# Patient Record
Sex: Male | Born: 1945
Health system: Southern US, Community
[De-identification: ages and names within clinical notes are randomized; demographics above are authoritative.]

## PROBLEM LIST (undated history)

## (undated) DIAGNOSIS — C8590 Non-Hodgkin lymphoma, unspecified, unspecified site: Secondary | ICD-10-CM

## (undated) DIAGNOSIS — R030 Elevated blood-pressure reading, without diagnosis of hypertension: Secondary | ICD-10-CM

## (undated) DIAGNOSIS — R079 Chest pain, unspecified: Secondary | ICD-10-CM

## (undated) DIAGNOSIS — C801 Malignant (primary) neoplasm, unspecified: Secondary | ICD-10-CM

## (undated) DIAGNOSIS — E785 Hyperlipidemia, unspecified: Secondary | ICD-10-CM

## (undated) DIAGNOSIS — C61 Malignant neoplasm of prostate: Secondary | ICD-10-CM

## (undated) DIAGNOSIS — E1165 Type 2 diabetes mellitus with hyperglycemia: Secondary | ICD-10-CM

## (undated) HISTORY — DX: Elevated blood-pressure reading, without diagnosis of hypertension: R03.0

## (undated) HISTORY — DX: Type 2 diabetes mellitus with hyperglycemia: E11.65

## (undated) HISTORY — DX: Malignant neoplasm of prostate: C61

## (undated) HISTORY — DX: Malignant (primary) neoplasm, unspecified: C80.1

## (undated) HISTORY — PX: BACK SURGERY: SHX140

## (undated) HISTORY — DX: Non-Hodgkin lymphoma, unspecified, unspecified site: C85.90

## (undated) HISTORY — PX: PROSTATECTOMY: SHX69

## (undated) HISTORY — DX: Hyperlipidemia, unspecified: E78.5

---

## 2000-06-29 ENCOUNTER — Ambulatory Visit (HOSPITAL_BASED_OUTPATIENT_CLINIC_OR_DEPARTMENT_OTHER): Admission: RE | Admit: 2000-06-29 | Discharge: 2000-06-29 | Payer: Self-pay | Admitting: Orthopaedic Surgery

## 2008-12-12 ENCOUNTER — Inpatient Hospital Stay (HOSPITAL_COMMUNITY): Admission: RE | Admit: 2008-12-12 | Discharge: 2008-12-14 | Payer: Self-pay | Admitting: Urology

## 2008-12-12 ENCOUNTER — Encounter (INDEPENDENT_AMBULATORY_CARE_PROVIDER_SITE_OTHER): Payer: Self-pay | Admitting: Urology

## 2010-10-13 IMAGING — CR DG CHEST 2V
2 series · 2 of 2 positions shown · non-contrast
Comparison: None

CLINICAL DATA: Preoperative evaluation.

CHEST - 2 VIEW

[w chest pa]
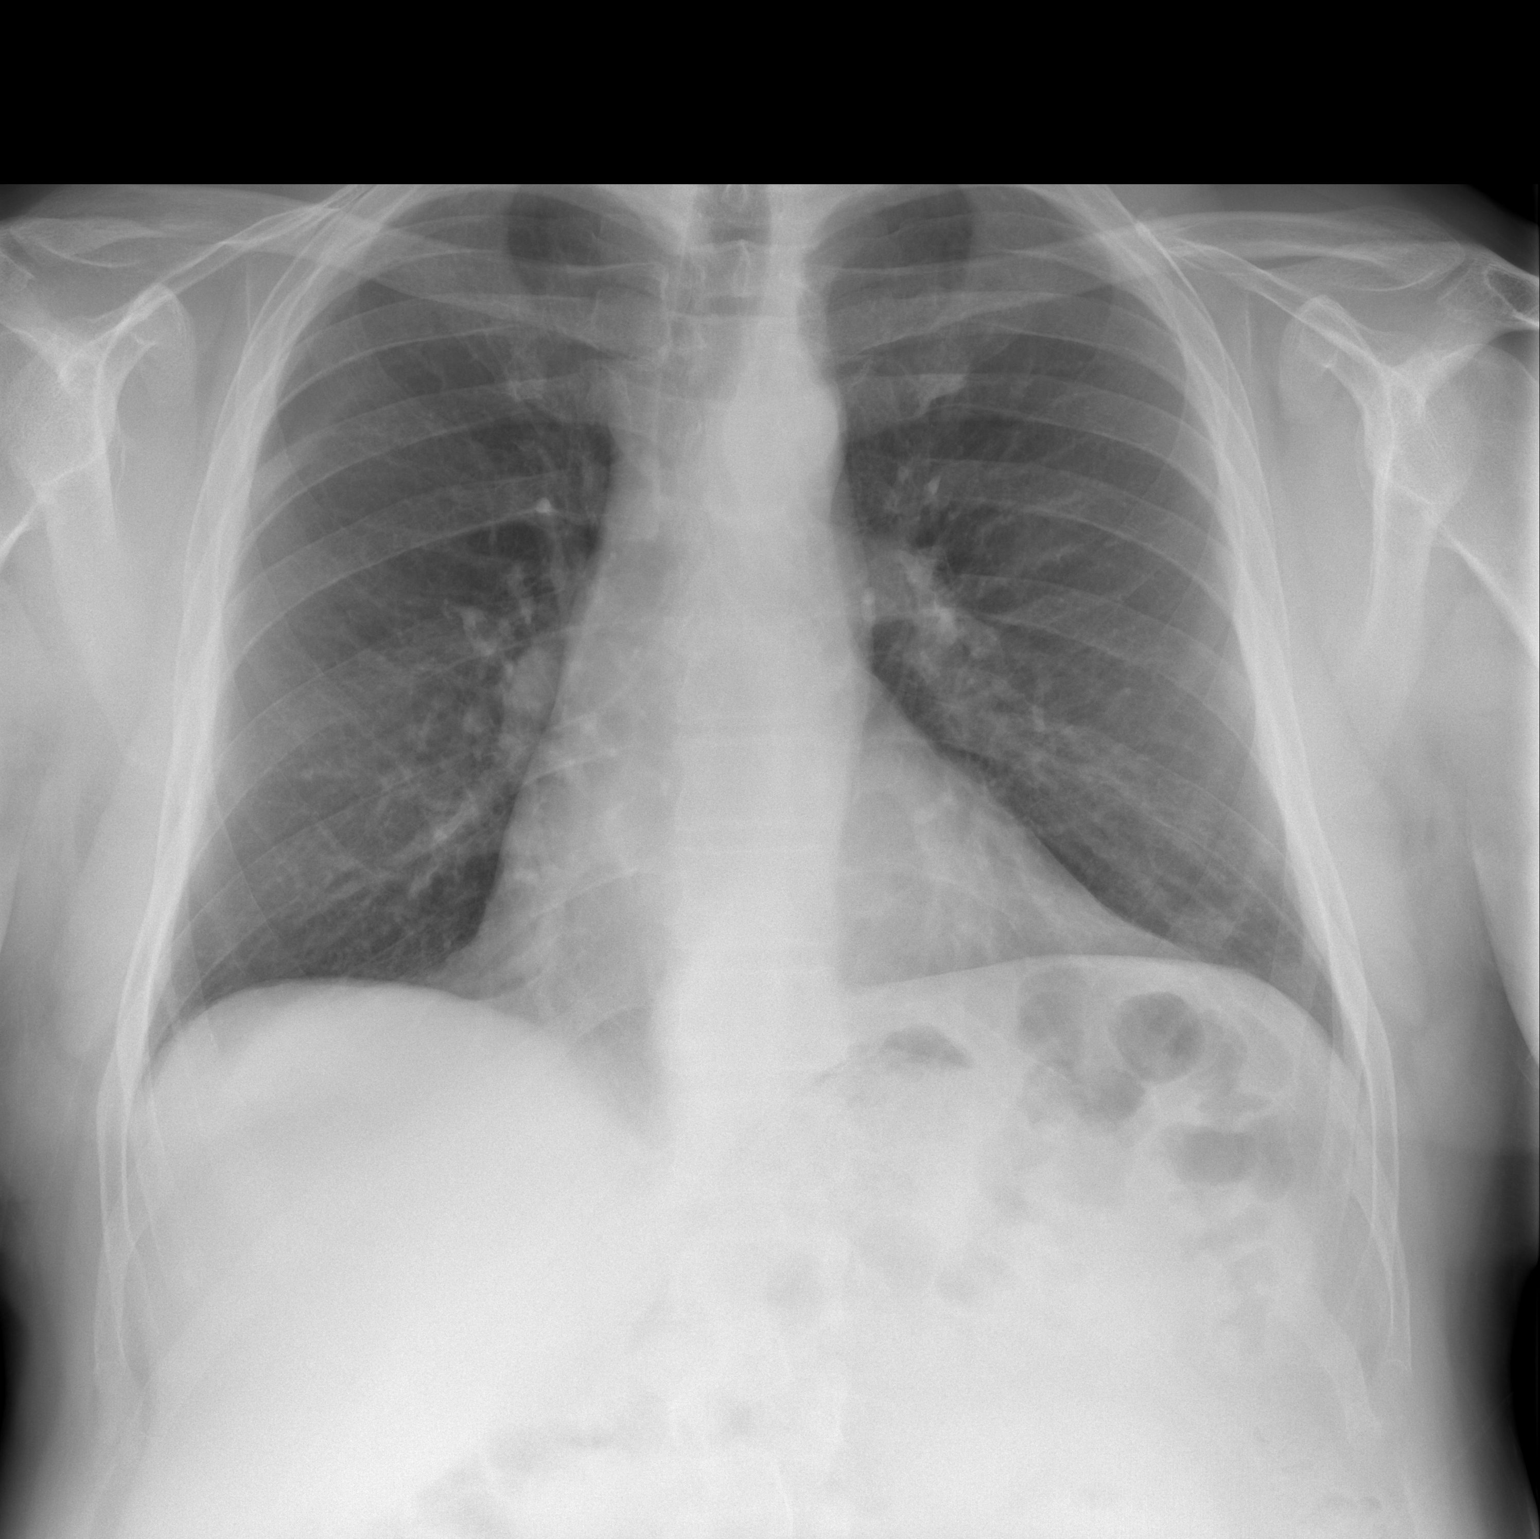

[w chest lat]
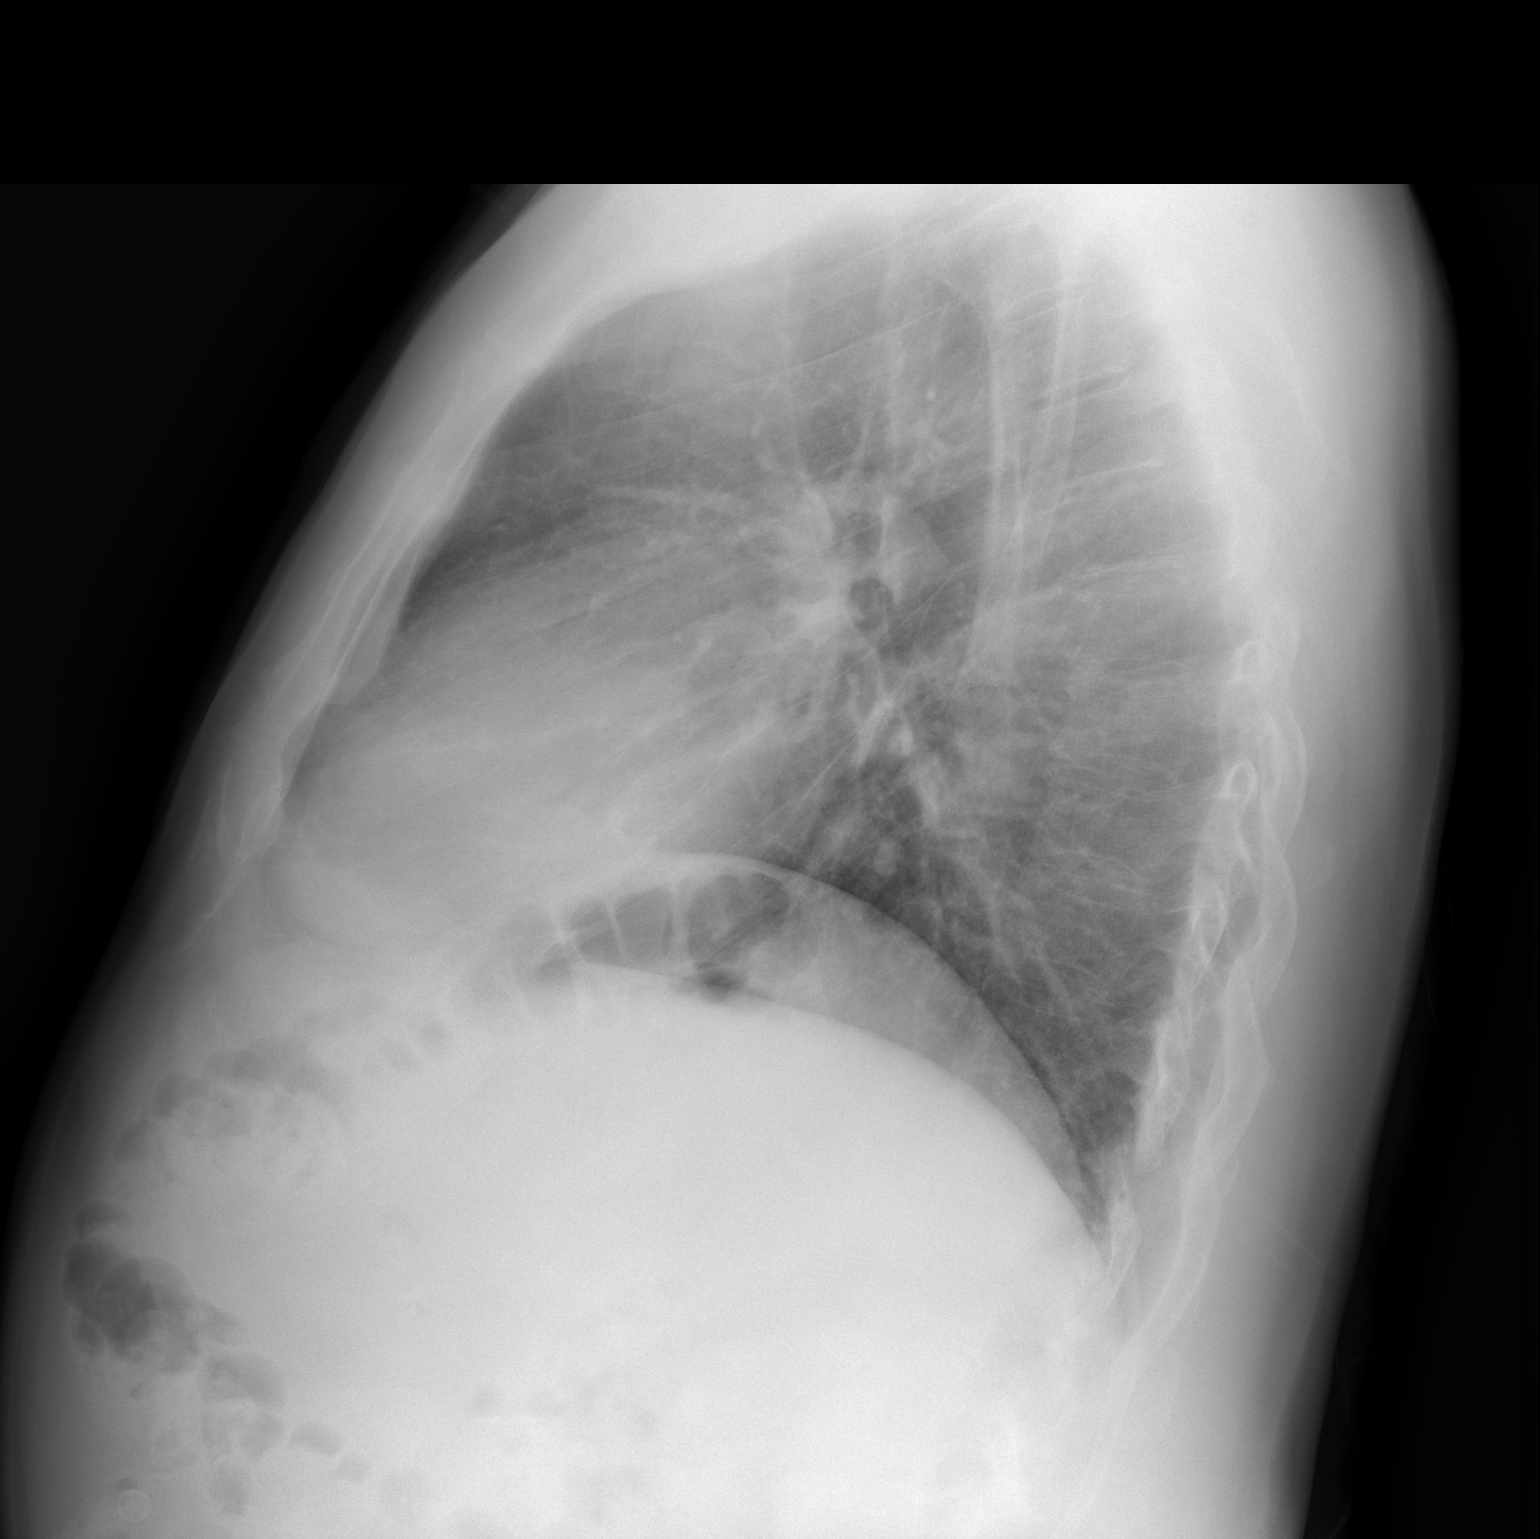

[2 of 2 positions shown; findings below may reference images not displayed]

FINDINGS: The lungs are clear.  The heart and mediastinal contours
are normal.  Mid thoracic spine degenerative changes.
IMPRESSION: No acute cardiopulmonary process.

## 2011-02-03 LAB — CBC
HCT: 41.4 % (ref 39.0–52.0)
Hemoglobin: 11.3 g/dL — ABNORMAL LOW (ref 13.0–17.0)
Hemoglobin: 13.6 g/dL (ref 13.0–17.0)
MCHC: 32.8 g/dL (ref 30.0–36.0)
MCHC: 32.8 g/dL (ref 30.0–36.0)
RBC: 3.95 MIL/uL — ABNORMAL LOW (ref 4.22–5.81)
RBC: 4.75 MIL/uL (ref 4.22–5.81)
RDW: 14.1 % (ref 11.5–15.5)
WBC: 9.8 10*3/uL (ref 4.0–10.5)

## 2011-02-03 LAB — BASIC METABOLIC PANEL
CO2: 28 mEq/L (ref 19–32)
CO2: 30 mEq/L (ref 19–32)
Calcium: 8 mg/dL — ABNORMAL LOW (ref 8.4–10.5)
Chloride: 107 mEq/L (ref 96–112)
GFR calc Af Amer: >60 mL/min (ref >60)
Glucose, Bld: 124 mg/dL — ABNORMAL HIGH (ref 70–99)
Potassium: 3.9 mEq/L (ref 3.5–5.1)
Sodium: 139 mEq/L (ref 135–145)
Sodium: 139 mEq/L (ref 135–145)

## 2011-02-03 LAB — HEMOGLOBIN AND HEMATOCRIT, BLOOD
HCT: 35.2 % — ABNORMAL LOW (ref 39.0–52.0)
HCT: 37.5 % — ABNORMAL LOW (ref 39.0–52.0)

## 2011-03-03 NOTE — Discharge Summary (Signed)
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   PROCEDURES:  1. Robotic-assisted laparoscopic radical prostatectomy (bilateral      nerve sparing).  2. Bilateral laparoscopic pelvic lymphadenopathy.   HISTORY AND PHYSICAL:  For full details, please see admission history  and physical.  Briefly, Ivan Simon is a 65 year old gentleman who was  found to have clinically localized adenocarcinoma of the prostate.  After careful consideration regarding management options for treatment,  he elected to proceed with surgical therapy and a robotic-assisted  laparoscopic radical prostatectomy.   HOSPITAL COURSE:  On December 12, 2008, he was taken to the operating  room where he underwent the above-named procedure in which he tolerated  well without complications.  Postoperatively, he was able to be  transferred to a regular hospital room following recovery from  anesthesia.  He remained hemodynamically stable.  His postoperative  hematocrit was 37.5.  On the morning of postoperative day #1, his  hematocrit was also found to be stable at 35.2.  He maintained excellent  urine output with minimal output from his pelvic drain therefore the  pelvic drain was removed.  He was placed on a clear liquid diet and  continued to ambulate.  He was reevaluated on the afternoon of  postoperative day #1.  At this time, he was found to have abdominal  distention and severe abdominal discomfort  associated with nausea and  some vomiting.  Due to his nausea, his IV fluids were restarted and he  continued to ambulate.  He was reassessed on postoperative day #2 and  found to feel much better having passed flatus, less abdominal  distention, and less abdominal discomfort.  He was, at that time, placed  back on his clear liquid diet.  On the afternoon of postoperative day  #2, he was reassessed and was tolerating his clear liquids without any  nausea or vomiting and continued to deny any abdominal distention or  discomfort.  Therefore, he was felt at this time to be stable for  discharge home as he had met all discharge criteria.   DISPOSITION:  Home.   DISCHARGE MEDICATIONS:  He was instructed to resume his regular home  medications consisting of pravastatin and Protonix.  He was instructed  to hold his multivitamins and herbal supplements for 7 days  postoperatively.  In addition, he was provided a prescription for  Vicodin to take as needed for pain and told to use Colace as a stool  softener.  He was given a prescription for Cipro to begin 1 day before  followup appointment.   DISCHARGE INSTRUCTIONS:  He was instructed to be ambulatory but  specifically told to refrain from any heavy lifting, strenuous activity,  or driving.  He was instructed on routine Foley catheter care and told  to gradually advance his diet over the course of the next few days.   FOLLOWUP:  He will follow up in 1 week for removal of Foley catheter and  skin staples.      Delia Chimes, NP      Heloise Purpura, MD  Electronically Signed    MA/MEDQ  D:  12/14/2008  T:  12/14/2008  Job:  [id number]

## 2011-03-03 NOTE — Op Note (Signed)
Ivan Simon, [name] NO.:  [id number]   MEDICAL RECORD NO.:  [id number]          PATIENT TYPE:  INP   LOCATION:  0010                         FACILITY:  [facility]   PHYSICIAN:  Heloise Purpura, MD      DATE OF BIRTH:  05-26-1946   DATE OF PROCEDURE:  12/12/2008  DATE OF DISCHARGE:                               OPERATIVE REPORT   PREOPERATIVE DIAGNOSIS:  Clinically localized adenocarcinoma of the  prostate (clinical stage T1c, N0, MX).   POSTOPERATIVE DIAGNOSIS:  Clinically localized adenocarcinoma of the  prostate (clinical stage T1c, N0, MX).   PROCEDURES:  1. Robotic assisted laparoscopic radical prostatectomy (bilateral      nerve sparing).  2. Bilateral laparoscopic pelvic lymphadenectomy.   SURGEON:  Dr. Heloise Purpura.   ASSISTANT:  Delia Chimes, nurse practitioner.   ANESTHESIA:  General.   COMPLICATIONS:  None.   ESTIMATED BLOOD LOSS:  200 mL.   INTRAVENOUS FLUIDS:  1500 mL of lactated Ringer's.   SPECIMENS:  1. Prostate seminal vesicles.  2. Right pelvic lymph nodes.  3. Left pelvic lymph nodes.   DISPOSITION:  Specimens to pathology.   DRAINS:  1. A 20-French coude catheter.  2. A #19 Blake pelvic drain.   INDICATIONS:  [name] is a 65 year old gentleman with clinically  localized adenocarcinoma of the prostate.  After a discussion regarding  management options for treatment, he elected to proceed with surgical  therapy and the above procedures.  The potential risks, complications,  and alternative options were discussed in detail and informed consent  was obtained.   DESCRIPTION OF PROCEDURE:  The patient was taken to the operating room  and a general anesthetic was administered.  He was given preoperative  antibiotics, placed in the dorsal lithotomy position, and prepped and  draped in the usual sterile fashion.  Next, a preoperative timeout was  performed.  A site was then selected just to the left of the umbilicus  for placement  of the camera port.  A Foley catheter was inserted into  the bladder and a 12 mm port was placed at the site selected for the  camera port using a standard open Hassan technique.  This allowed entry  into the peritoneal cavity under direct vision and once the port was  placed, a pneumoperitoneum was established.  A 0 degree lens was used to  inspect the abdomen there was no evidence for any intra-abdominal  injuries or other abnormalities.  The remaining ports were then placed.  Bilateral 8 mm robotic ports were placed 10 cm lateral to and just  inferior to the camera port site.  An additional 8 mm robotic port was  placed in the far left lateral abdominal wall.  A 5 mm port was placed  between the camera port and the right robotic port and a 12 mm port was  placed in the far right lateral abdominal wall for laparoscopic  assistance.  All ports were placed under direct vision and without  difficulty.  The surgical cart was then docked.  With the aid of the  cautery scissors, the  bladder was reflected posteriorly allowing entry  into space of Retzius and identification of the endopelvic fascia and  prostate.  The endopelvic fascia was then incised from the base to the  apex of the prostate bilaterally and the underlying levator muscle  fibers were swept laterally off the prostate, thereby isolating the  dorsal venous complex.  The dorsal venous complex was then stapled and  divided with a 45 mm flex ETS stapler.  The bladder neck was identified  with the aid of Foley catheter manipulation and was divided anteriorly,  thereby exposing the Foley catheter.  The catheter balloon was deflated  and the catheter was brought into the operative field and used to  retract the prostate anteriorly.  This exposed the posterior bladder  neck which appeared unremarkable.  The posterior bladder neck was then  divided and dissection proceeded between the bladder and prostate  posteriorly until the vasa  deferentia and seminal vesicles were  identified.  The vasa deferentia were isolated, divided and lifted  anteriorly.  The seminal vesicles and then dissected down to their tips  with care to control the seminal vesicle arterial blood supply.  The  seminal vesicles were then lifted anteriorly and the space between  Denonvilliers fascia and the anterior rectum was bluntly developed,  thereby isolating the vascular pedicles of the prostate.  The lateral  prostatic fascia was then incised bilaterally releasing the  neurovascular bundles.  The vascular pedicles of the prostate with an  ligated with Hem-o-lok clips above the level of the neurovascular  bundles with a slightly wider dissection performed on the left side of  the prostate based on the patient's high-risk disease on this side.  The  neurovascular bundles were then swept off the apex of the prostate and  urethra and the urethra was sharply transected, allowing the specimen to  be disarticulated.  The pelvis was copiously irrigated and hemostasis  was ensured.  There was no evidence for a rectal injury.  Attention then  turned to the right pelvic sidewall.  The fibrofatty tissue between the  external iliac vein, confluence of the iliac vessels, hypogastric  artery, and Cooper's ligament was dissected free from the pelvic  sidewall with care to preserve the obturator nerve.  Hem-o-lok clips  were used for lymphostasis and hemostasis.  An identical procedure was  then performed on the contralateral side.  Both lymphatic packets were  then sent for permanent pathologic analysis.  Attention then turned to  the urethral anastomosis.  A 2-0 Vicryl slip-knot was placed between the  bladder neck and the urethra, as well as Denonvilliers fascia.  A double-  armed 3-0 Monocryl suture was then used to perform a 360 degree running  tension-free anastomosis between the urethra and bladder.  A new 20-  [place] coude catheter was inserted into the  bladder and irrigated.  There were no blood clots within the bladder and the anastomosis  appeared to be watertight.  A #19 Blake drain was brought through the  left robotic port and appropriately positioned in the pelvis.  It was  secured to skin with a nylon suture.  The surgical cart was then  undocked.  The right lateral 12 mm port site was closed with a 0 Vicryl  suture placed with the aid of a suture passer device.  All remaining  ports were removed under direct vision and the prostate specimen was  removed intact within the Endopouch retrieval bag via the periumbilical  port site.  This fascial opening was then closed with a running 0 Vicryl  suture.  All port sites were injected with quarter-percent Marcaine and  reapproximated at the skin level with staples.  Sterile dressings were  applied.  The patient was then able to be extubated and transferred to  the recovery unit in satisfactory condition.     Heloise Purpura, MD  Electronically Signed    LB/MEDQ  D:  12/12/2008  T:  12/13/2008  Job:  [id number]

## 2011-03-06 NOTE — Op Note (Signed)
[place]  Patient:    SOLACE, WENDORFF                         MRN: [id number] Proc. Date: 06/29/00 Adm. Date:  [id number] Attending:  Marcene Corning                           Operative Report  PREOPERATIVE DIAGNOSES: 1. 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 2. Left ulnar nerve compression.  POSTOPERATIVE DIAGNOSES: 1. 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 2. Left ulnar nerve compression.  PROCEDURES: 1. Left elbow extensor carpi radialis brevis release. 2. Left elbow ulnar nerve decompression.  ANESTHESIA:  General.  SURGEON:  Lubertha Basque. Jerl Santos, M.D.  ASSISTANT:  Prince Rome, P.A.  INDICATIONS:  The patient is a 65 year old man, who has a history of right elbow difficulty, which was eventually cured by ECRB release.  He has felt the same symptoms on the left and responded to an injection.  At this point, he is interested in a release operation as he had on the opposite side.  He has also had ulnar distribution numbness on that hand and would like that addressed as well.  He was offered operative intervention at the lateral epicondyle to consist of an ECRB release and we decided at the same sitting, to add an ulnar decompression on the opposite side of the elbow as would not add much in the way of morbidity to his recovery.  The procedures were discussed with the patient and informed operative consent was obtained after discussion of possible complications of, reaction to anesthesia, infection, and neurovascular injury.  DESCRIPTION OF PROCEDURE:  The patient was taken to the operating suite, where a general anesthetic was applied without difficulty.  He was positioned supine and prepped and draped in normal sterile fashion.  After administration of preoperative IV antibiotics, the arm was elevated, exsanguinated, and a tourniquet inflated about the upper arm.  A medial incision was made  starting between the medial epicondyle and olecranon and extending distally. Dissection was carried down to find the ulnar nerve in the cubital tunnel. This nerve was then decompressed in situ all the way down between the heads of the FCU.  There was a band of fascia, which appeared to be constricting the nerve in this area.  The decompression was not taken in a proximal direction. The nerve was very irritable as dissection was carried distally through the FCU.  A 2-0 undyed Vicryl was used to reapproximate subcutaneous tissues, followed by closure of the skin with vertical mattresses of nylon.  Attention was then turned to the lateral aspect of the same elbow.  A small lateral incision was made with dissection down to the extensor origin.  This was split longitudinally and the ECRB origin was elevated off the humerus.  Some devitalized tissue was seen and was resected.  A portion of this extensor origin was allowed to slip distally a few mm.  A single suture of 2-0 Vicryl was used to reapproximate the longitudinal split in the extensor origin.  At this point, 2-0 undyed Vicryl was used to reapproximate subcutaneous tissues and skin was closed with vertical mattresses of nylon.  The tourniquet was deflated and the hand became pink and warm immediately.  Adaptic was placed on both wounds, followed by dry gauze and a loose Ace wrap.  Estimated blood loss and intraoperative fluids, as well as,  accurate tourniquet time can be obtained from anesthesia records.  DISPOSITION:  The patient was extubated in the operating room and taken to recovery room in stable condition.  PLANS:  Were for him to go home same day and follow up in the office in less than a week.  I will contact him by phone tonight. DD:  06/29/00 TD:  06/30/00 Job: [id number]

## 2014-08-20 ENCOUNTER — Ambulatory Visit (INDEPENDENT_AMBULATORY_CARE_PROVIDER_SITE_OTHER): Payer: Medicare HMO | Admitting: Interventional Cardiology

## 2014-08-20 ENCOUNTER — Encounter: Payer: Self-pay | Admitting: Interventional Cardiology

## 2014-08-20 VITALS — BP 142/90 | HR 77 | Ht 70.0 in | Wt 211.1 lb

## 2014-08-20 DIAGNOSIS — C61 Malignant neoplasm of prostate: Secondary | ICD-10-CM

## 2014-08-20 DIAGNOSIS — R030 Elevated blood-pressure reading, without diagnosis of hypertension: Secondary | ICD-10-CM

## 2014-08-20 DIAGNOSIS — I4510 Unspecified right bundle-branch block: Secondary | ICD-10-CM

## 2014-08-20 DIAGNOSIS — R9431 Abnormal electrocardiogram [ECG] [EKG]: Secondary | ICD-10-CM

## 2014-08-20 DIAGNOSIS — IMO0002 Reserved for concepts with insufficient information to code with codable children: Secondary | ICD-10-CM

## 2014-08-20 DIAGNOSIS — E785 Hyperlipidemia, unspecified: Secondary | ICD-10-CM

## 2014-08-20 DIAGNOSIS — R0789 Other chest pain: Secondary | ICD-10-CM

## 2014-08-20 DIAGNOSIS — IMO0001 Reserved for inherently not codable concepts without codable children: Secondary | ICD-10-CM

## 2014-08-20 DIAGNOSIS — E1165 Type 2 diabetes mellitus with hyperglycemia: Secondary | ICD-10-CM

## 2014-08-20 DIAGNOSIS — I450 Right fascicular block: Secondary | ICD-10-CM

## 2014-08-20 HISTORY — DX: Malignant neoplasm of prostate: C61

## 2014-08-20 HISTORY — DX: Reserved for inherently not codable concepts without codable children: IMO0001

## 2014-08-20 HISTORY — DX: Hyperlipidemia, unspecified: E78.5

## 2014-08-20 HISTORY — DX: Type 2 diabetes mellitus with hyperglycemia: E11.65

## 2014-08-20 HISTORY — DX: Reserved for concepts with insufficient information to code with codable children: IMO0002

## 2014-08-20 NOTE — Patient Instructions (Signed)
Your physician recommends that you continue on your current medications as directed. Please refer to the Current Medication list given to you today.  Your physician has requested that you have an exercise tolerance test. For further information please visit [email or web address]. Please also follow instruction sheet, as given.  Your physician recommends that you schedule a follow-up appointment pending results  

## 2014-08-20 NOTE — Progress Notes (Signed)
Patient ID: Ivan Simon, male   DOB: 12-Sep-1946, 68 y.o.   MRN: [id number]   Date: 08/20/2014 ID: Ivan Simon, DOB 06-07-1946, MRN [id number] PCP: No primary care provider on file.  Reason: abnormal EKG and chest discomfort  ASSESSMENT;  1. Incomplete right bundle branch block 2. Elevated blood pressure 3. Chest discomfort, current predominantly at rest, described as a tightness with radiation to the back 4. History of adenocarcinoma of the prostate 5. History of lymphoma 6. Diabetes mellitus, type II, recent diagnosis 6. Hyperlipidemia, recent diagnosis, on therapy  PLAN:  1. Exercise treadmill test. I am suspicious that elevated blood pressure may be causing dyspnea on exertion. This test will also help [place] to exclude coronary disease considering age, diabetes, and hyperlipidemia.   SUBJECTIVE: Ivan Simon is a 68 y.o. male who is here for evaluation after recent primary care visit demonstrated an RSR prime in V1. She became concerned that the EKG my represent early coronary disease. Recently diagnosed with diabetes. On therapy for hyperlipidemia. Also some labile blood pressures. 67 year history of exertional and nonexertional chest discomfort. Has had a normal coronary angiogram within the past 7 years. Denies orthopnea, PND, and edema. Is limited in physical activity by dyspnea. No prior history of smoking. No orthopnea PND. No claudication.   Not on File  No current outpatient prescriptions on file prior to visit.   No current facility-administered medications on file prior to visit.    Past Medical History  Diagnosis Date  . Cancer     prostate  . Lymphoma     Past Surgical History  Procedure Laterality Date  . Prostatectomy    . Back surgery      removal of lymphoma    History   Social History  . Marital Status: Married    Spouse Name: N/A    Number of Children: N/A  . Years of Education: N/A   Occupational History  . Not on file.   Social History Main  Topics  . Smoking status: Never Smoker   . Smokeless tobacco: Not on file  . Alcohol Use: Not on file  . Drug Use: Not on file  . Sexual Activity: Not on file   Other Topics Concern  . Not on file   Social History Narrative  . No narrative on file    Family History  Problem Relation Age of Onset  . Stroke Father   . Hypertension Sister   . Anemia Neg Hx   . Arrhythmia Neg Hx   . Asthma Neg Hx   . Clotting disorder Neg Hx   . Fainting Neg Hx   . Heart attack Neg Hx   . Heart disease Neg Hx   . Heart failure Neg Hx   . Hyperlipidemia Neg Hx     ROS: denies transient neurological complaints. No kidney or liver disease. History of prostate cancer. Never smoked. Family history of stroke, father. Denies claudication. No history of asthma.. Other systems negative for complaints.  OBJECTIVE: BP 142/90 mmHg  Pulse 77  Ht 5\' 10"  (1.778 m)  Wt 211 lb 1.9 oz (95.763 kg)  BMI 30.29 kg/m2,  General: No acute distress, mildly obese HEENT: normal without pallor or jaundice Neck: JVD flat. Carotids absent Chest: clear Cardiac: Murmur: 9. Gallop: S4. Rhythm: normal. Other: normal Abdomen: Bruit: absent. Pulsation:normal/none Extremities: Edema: absent. Pulses: 2+ Neuro: normal Psych: normal  ECG: normal sinus rhythm, RSR prime V1, left atrial abnormality

## 2014-09-18 ENCOUNTER — Ambulatory Visit (INDEPENDENT_AMBULATORY_CARE_PROVIDER_SITE_OTHER): Payer: Medicare HMO | Admitting: Nurse Practitioner

## 2014-09-18 ENCOUNTER — Encounter: Payer: Self-pay | Admitting: Nurse Practitioner

## 2014-09-18 DIAGNOSIS — IMO0001 Reserved for inherently not codable concepts without codable children: Secondary | ICD-10-CM

## 2014-09-18 DIAGNOSIS — R030 Elevated blood-pressure reading, without diagnosis of hypertension: Secondary | ICD-10-CM

## 2014-09-18 DIAGNOSIS — R9431 Abnormal electrocardiogram [ECG] [EKG]: Secondary | ICD-10-CM

## 2014-09-18 NOTE — Progress Notes (Signed)
Exercise Treadmill Test  Pre-Exercise Testing Evaluation Rhythm: normal sinus  Rate: 93 bpm     Test  Exercise Tolerance Test Ordering MD: Daneen Schick, MD  Interpreting MD: Truitt Merle, NP  Unique Test No: 1  Treadmill:  1  Indication for ETT: Abnormal EKG  Contraindication to ETT: No   Stress Modality: exercise - treadmill  Cardiac Imaging Performed: non   Protocol: standard Deovion - maximal  Max BP:  203/74  Max MPHR (bpm):  152 85% MPR (bpm):  129  MPHR obtained (bpm):  164 % MPHR obtained:  107%  Reached 85% MPHR (min:sec):  2:52 Total Exercise Time (min-sec):  9 minutes  Workload in METS:  10.1 Borg Scale: 15  Reason ETT Terminated:  desired heart rate attained    ST Segment Analysis At Rest: normal ST segments - no evidence of significant ST depression With Exercise: no evidence of significant ST depression  Other Information Arrhythmia:  No Angina during ETT:  absent (0) Quality of ETT:  diagnostic  ETT Interpretation:  normal - no evidence of ischemia by ST analysis  Comments: Patient presents today for routine GXT. Has incomplete RBBB on EKG, HTN, DM and atypical CP.    Today the patient exercised on the standard Makael protocol for a total of 9 minutes.  Good exercise tolerance.  Adequate blood pressure response.  Clinically + for chest pain at peak of exercise - resolved quickly in recovery. Test was stopped due to achievement of target HR.  EKG with RSR'. EKG negative for ischemia but has nonspecific ST changes. No significant arrhythmia noted.   Recommendations: Reviewed with Dr. Acie Fredrickson (DOD) Will arrange for stress Myoview given CV risk factors, nonspecific changes and chest discomfort.    See back as planned  Patient is agreeable to this plan and will call if any problems develop in the interim.   Burtis Junes, RN, [facility] [address] [place], [place]  [address] [phone]

## 2014-09-21 ENCOUNTER — Ambulatory Visit (HOSPITAL_COMMUNITY): Payer: Medicare HMO | Attending: Cardiology | Admitting: Radiology

## 2014-09-21 VITALS — BP 135/86 | HR 73 | Ht 70.0 in | Wt 208.0 lb

## 2014-09-21 DIAGNOSIS — E119 Type 2 diabetes mellitus without complications: Secondary | ICD-10-CM | POA: Diagnosis not present

## 2014-09-21 DIAGNOSIS — R079 Chest pain, unspecified: Secondary | ICD-10-CM | POA: Diagnosis not present

## 2014-09-21 DIAGNOSIS — R9431 Abnormal electrocardiogram [ECG] [EKG]: Secondary | ICD-10-CM

## 2014-09-21 DIAGNOSIS — R030 Elevated blood-pressure reading, without diagnosis of hypertension: Secondary | ICD-10-CM

## 2014-09-21 DIAGNOSIS — IMO0001 Reserved for inherently not codable concepts without codable children: Secondary | ICD-10-CM

## 2014-09-21 MED ORDER — TECHNETIUM TC 99M SESTAMIBI GENERIC - CARDIOLITE
33.0000 | Freq: Once | INTRAVENOUS | Status: AC | PRN
  Administered 2014-09-21: 33 via INTRAVENOUS

## 2014-09-21 MED ORDER — TECHNETIUM TC 99M SESTAMIBI GENERIC - CARDIOLITE
11.0000 | Freq: Once | INTRAVENOUS | Status: AC | PRN
  Administered 2014-09-21: 11 via INTRAVENOUS

## 2014-09-21 NOTE — Progress Notes (Signed)
[facility] [address] [place], [place] [address] [phone]    Cardiology Nuclear Med Study  Ivan Simon is a 68 y.o. male     MRN : [id number]     DOB: [birth year]  Procedure Date: 09/21/2014  Nuclear Med Background Indication for Stress Test:  Evaluation for Ischemia and Abnormal GXT History:  No Cardiac History Cardiac Risk Factors: NIDDM  Symptoms:  Chest Pain (last date of chest discomfort was this morning)   Nuclear Pre-Procedure Caffeine/Decaff Intake:  None NPO After: 6 pm   Lungs:  clear O2 Sat: 97% on room air. IV 0.9% NS with Angio Cath:  22g  IV Site: R Hand  IV Started by:  Crissie Figures, RN  Chest Size (in):  46 Cup Size: n/a  Height: 5\' 10"  (1.778 m)  Weight:  208 lb (94.348 kg)  BMI:  Body mass index is 29.84 kg/(m^2). Tech Comments:  N/A    Nuclear Med Study 1 or 2 day study: 1 day  Stress Test Type:  Stress  Reading MD: N/A  Order Authorizing Provider:  Daneen Schick, MD  Resting Radionuclide: Technetium 42m Sestamibi  Resting Radionuclide Dose: 11.0 mCi   Stress Radionuclide:  Technetium 60m Sestamibi  Stress Radionuclide Dose: 33.0 mCi           Stress Protocol Rest HR: 73 Stress HR: 142  Rest BP: 135/86 Stress BP: 175/57  Exercise Time (min): 6:00 METS: 7.0   Predicted Max HR: 152 bpm % Max HR: 93.42 bpm Rate Pressure Product: 25134   Dose of Adenosine (mg):  n/a Dose of Lexiscan: n/a mg  Dose of Atropine (mg): n/a Dose of Dobutamine: n/a mcg/kg/min (at max HR)  Stress Test Technologist: Glade Lloyd, BS-ES  Nuclear Technologist:  Earl Many, CNMT     Rest Procedure:  Myocardial perfusion imaging was performed at rest 45 minutes following the intravenous administration of Technetium 93m Sestamibi. Rest ECG: Incomplete RBBB  Stress Procedure:  The patient exercised on the treadmill utilizing the Phinehas Protocol for 6:00 minutes. The patient stopped due to fatigue and denied any chest pain.  Technetium 71m  Sestamibi was injected at peak exercise and myocardial perfusion imaging was performed after a brief delay. Stress ECG: No significant ST segment change suggestive of ischemia.  QPS Raw Data Images:  Normal; no motion artifact; normal heart/lung ratio. Stress Images:  Normal homogeneous uptake in all areas of the myocardium. Rest Images:  Normal homogeneous uptake in all areas of the myocardium. Subtraction (SDS):  No evidence of ischemia. Transient Ischemic Dilatation (Normal <1.22):  0.91 Lung/Heart Ratio (Normal <0.45):  0.28  Quantitative Gated Spect Images QGS EDV:  91 ml QGS ESV:  37 ml  Impression Exercise Capacity:  Good exercise capacity. BP Response:  Normal blood pressure response. Clinical Symptoms:  No chest pain. ECG Impression:  No significant ST segment change suggestive of ischemia. Comparison with Prior Nuclear Study: No previous nuclear study performed  Overall Impression:  Normal stress nuclear study.  LV Ejection Fraction: 59%.  LV Wall Motion:  NL LV Function; NL Wall Motion   Darlin Coco MD

## 2014-09-25 ENCOUNTER — Ambulatory Visit (INDEPENDENT_AMBULATORY_CARE_PROVIDER_SITE_OTHER): Payer: Medicare HMO | Admitting: Physician Assistant

## 2014-09-25 ENCOUNTER — Encounter: Payer: Self-pay | Admitting: Physician Assistant

## 2014-09-25 VITALS — BP 150/86 | HR 86 | Ht 70.0 in | Wt 211.4 lb

## 2014-09-25 DIAGNOSIS — R079 Chest pain, unspecified: Secondary | ICD-10-CM

## 2014-09-25 NOTE — Progress Notes (Signed)
Cardiology Office Note    Date:  09/25/2014   ID:  Ivan Simon, DOB 1946/10/14, MRN [id number]  PCP:  No primary care provider on file.  Cardiologist:  Dr. Tamala Julian      History of Present Illness: Ivan Simon is a 68 y.o. male with a history of IRBBB, HTN, prostate cancer s/p resection, lymphoma, OSA on CPAP, DM and HLD who was added on the clinic schedule today for evaluation of recurrent chest pain.   He was initially referred to cardiology after a primary care visit demonstrated an RSR prime in V1. He was recently diagnosed with diabetes. On therapy for hyperlipidemia. Also some labile blood pressures. 6-7 year history of exertional and nonexertional chest discomfort. Has had a normal coronary angiogram within the past 7 years. Is limited in physical activity by dyspnea. No prior history of smoking. He was seen by Dr. Tamala Julian on 08/20/14 who was suspicious that his elevated BPs were causing his dyspnea. He ordered a GXT for further work up. He underwent GXT on 09/18/14 which was essential normal; however, there were some non specific ECG changes and the patient developed chest pain. So it was decided to proceed with nuclear stress rest given his CV risk factors. He underwent nuclear stress test on 09/21/14 which returned normal. However, the patient called the office to report that he was having left arm and chest ache ever since treadmill, so he was added on to the [facility] clinic schedule.   Today he complains of chest pain in his left chest that feels like a squeezing pressure than radiates from within his chest to the outside. It occurs everyday and lasts all day. He wakes up feeling okay but then as he gets moving the chest pain begins and comes and goes in intensity, but is always there. He also feels extremely SOB with minimal exertion and is constantly fatigued. He uses CPAP everyday night and all the equipment is functioning properly.   Studies:  - Nuclear (09/21/14): normal nuclear stress  study, EF 59%. NL LV function. NL wall motion     Recent Labs/Images:  No results for input(s): NA, K, BUN, CREATININE, ALT, HGB, TSH, LDLCALC, LDLDIRECT, HDL, BNP, PROBNP in the last 8760 hours.  Invalid input(s): LDL   No results found.   Wt Readings from Last 3 Encounters:  09/25/14 211 lb 6.4 oz (95.89 kg)  09/21/14 208 lb (94.348 kg)  08/20/14 211 lb 1.9 oz (95.763 kg)     Past Medical History  Diagnosis Date  . Cancer     prostate  . Lymphoma   . Elevated blood pressure 08/20/2014  . Prostate cancer 08/20/2014  . Diabetes mellitus type II, uncontrolled 08/20/2014  . Hyperlipidemia 08/20/2014    Current Outpatient Prescriptions  Medication Sig Dispense Refill  . cetirizine (ZYRTEC) 10 MG tablet Take 10 mg by mouth daily.    Marland Kitchen CINNAMON PO Take by mouth daily.    . fenofibrate 160 MG tablet Take 160 mg by mouth.    . metFORMIN (GLUCOPHAGE) 500 MG tablet Take 1,000 mg by mouth.    . Omega-3 Fatty Acids (FISH OIL) 1200 MG CAPS Take 1,200 mg by mouth 2 (two) times daily.    Marland Kitchen omeprazole (PRILOSEC) 20 MG capsule Take 20 mg by mouth.    . rosuvastatin (CRESTOR) 20 MG tablet Take 20 mg by mouth.     No current facility-administered medications for this visit.     Allergies:   Review of patient's allergies indicates  not on file.   Social History:  The patient  reports that he has never smoked. He does not have any smokeless tobacco history on file. He reports that he does not use illicit drugs.   Family History:  The patient's family history includes Hypertension in his sister; Stroke in his father. There is no history of Anemia, Arrhythmia, Asthma, Clotting disorder, Fainting, Heart attack, Heart disease, Heart failure, or Hyperlipidemia.   ROS:  Please see the history of present illness.  All other systems reviewed and negative.    PHYSICAL EXAM: VS:  BP 150/86 mmHg  Pulse 86  Ht 5\' 10"  (1.778 m)  Wt 211 lb 6.4 oz (95.89 kg)  BMI 30.33 kg/m2  SpO2 97% Well  nourished, well developed, in no acute distress HEENT: normal Neck: no JVD Cardiac:  normal S1, S2; RRR; no murmur Lungs: clear to auscultation bilaterally, no wheezing, rhonchi or rales Abd: soft, nontender, no hepatomegaly Ext: no edema Skin: warm and dry Neuro:  CNs 2-12 intact, no focal abnormalities noted  EKG:  None this visit      ASSESSMENT AND PLAN:  Ivan Simon is a 68 y.o. male with a history of IRBBB, HTN, prostate cancer s/p resection, lymphoma, OSA on CPAP, DM and HLD who was added on the clinic schedule today for evaluation of recurrent chest pain.  Recurrent chest pain- he continues to have squeezing chest pressure and dyspnea on minimal exertion. He does have significant CV risk factors including DM, HTN and HLD. I discussed the patient with his primary cardiologist, Dr. Tamala Julian. It was decided that we will need to proceed with LHC for definitive deliniation of his coronary anatomy.  -- This is set up for this Friday with Dr. Irish Lack. If no CAD, will focus on BP control and look for other causes of chest pain.  HTN- not on any BP agents currently. Did not tolerate Lopressor in the past. Today his pressure is 150/86. Consider adding an ACE in the setting of DM. Will defer this to after his heart catheterization.   HLD- continue statin, fish oil, and fenofibrate  DM- will hold metformin 24 hours before heart cath.    Disposition:  FU with APP or Dr. Tamala Julian in 2-3 weeks   Signed, Joen Laura, MHS 09/25/2014 2:27 PM    [address], [place], [place]  [address] Phone: [phone]; Fax: [phone]

## 2014-09-25 NOTE — Patient Instructions (Addendum)
Your physician recommends that you continue on your current medications as directed. Please refer to the Current Medication list given to you today.    Your physician recommends that you schedule a follow-up appointment in:  WITH DR Tamala Julian IN 2 TO 3 WEEKS     YOU HAVE BEEN SCHEDULED FOR A LEFT HEART CATH ON 09/28/14 @  9AM WITH DR Irish Lack YOU NEED TO ARRIVE TO THE NORTH TOWER ENTRANCE @ 7 AM WITH A CHANGE OF CLOTHING  MAKE SURE YOU HAVE NOTHING TO EAT OR DRINK AFTER MIDNIGHT EXCEPT WATER WITH YOUR MEDS THE MORNING OF YOUR PROCEDURE MAKE SURE YOU HAVE SOMEONE TO DRIVE YOU HOME   AFTERWARDS    MAKE SURE TO HOLD YOUR METFORMIN MORNING OF PROCEDURE    Your physician has requested that you have a cardiac catheterization. Cardiac catheterization is used to diagnose and/or treat various heart conditions. Doctors may recommend this procedure for a number of different reasons. The most common reason is to evaluate chest pain. Chest pain can be a symptom of coronary artery disease (CAD), and cardiac catheterization can show whether plaque is narrowing or blocking your heart's arteries. This procedure is also used to evaluate the valves, as well as measure the blood flow and oxygen levels in different parts of your heart. For further information please visit [email or web address]. Please follow instruction sheet, as given.

## 2014-09-28 ENCOUNTER — Ambulatory Visit (HOSPITAL_COMMUNITY)
Admission: RE | Admit: 2014-09-28 | Discharge: 2014-09-28 | Disposition: A | Discharge status: Home / Self Care | Payer: Medicare HMO | Source: Ambulatory Visit | Attending: Interventional Cardiology | Admitting: Interventional Cardiology

## 2014-09-28 ENCOUNTER — Encounter (HOSPITAL_COMMUNITY): Payer: Self-pay | Admitting: Interventional Cardiology

## 2014-09-28 ENCOUNTER — Encounter (HOSPITAL_COMMUNITY)
Admission: RE | Disposition: A | Discharge status: Home / Self Care | Payer: Self-pay | Source: Ambulatory Visit | Attending: Interventional Cardiology

## 2014-09-28 DIAGNOSIS — I2510 Atherosclerotic heart disease of native coronary artery without angina pectoris: Secondary | ICD-10-CM | POA: Insufficient documentation

## 2014-09-28 DIAGNOSIS — Z8546 Personal history of malignant neoplasm of prostate: Secondary | ICD-10-CM | POA: Insufficient documentation

## 2014-09-28 DIAGNOSIS — I4510 Unspecified right bundle-branch block: Secondary | ICD-10-CM | POA: Insufficient documentation

## 2014-09-28 DIAGNOSIS — Z79899 Other long term (current) drug therapy: Secondary | ICD-10-CM | POA: Insufficient documentation

## 2014-09-28 DIAGNOSIS — E785 Hyperlipidemia, unspecified: Secondary | ICD-10-CM | POA: Diagnosis not present

## 2014-09-28 DIAGNOSIS — R079 Chest pain, unspecified: Secondary | ICD-10-CM | POA: Diagnosis present

## 2014-09-28 DIAGNOSIS — G4733 Obstructive sleep apnea (adult) (pediatric): Secondary | ICD-10-CM | POA: Insufficient documentation

## 2014-09-28 DIAGNOSIS — R0789 Other chest pain: Secondary | ICD-10-CM | POA: Diagnosis present

## 2014-09-28 DIAGNOSIS — I10 Essential (primary) hypertension: Secondary | ICD-10-CM | POA: Diagnosis not present

## 2014-09-28 DIAGNOSIS — E119 Type 2 diabetes mellitus without complications: Secondary | ICD-10-CM | POA: Insufficient documentation

## 2014-09-28 HISTORY — PX: LEFT HEART CATHETERIZATION WITH CORONARY ANGIOGRAM: SHX5451

## 2014-09-28 HISTORY — DX: Chest pain, unspecified: R07.9

## 2014-09-28 LAB — BASIC METABOLIC PANEL
Anion gap: 10 (ref 5–15)
BUN: 26 mg/dL — AB (ref 6–23)
CALCIUM: 8.9 mg/dL (ref 8.4–10.5)
CO2: 26 mEq/L (ref 19–32)
Chloride: 106 mEq/L (ref 96–112)
Creatinine, Ser: 1.31 mg/dL (ref 0.50–1.35)
GFR calc Af Amer: 63 mL/min — ABNORMAL LOW (ref >90)
GFR calc non Af Amer: 54 mL/min — ABNORMAL LOW (ref >90)
GLUCOSE: 138 mg/dL — AB (ref 70–99)
POTASSIUM: 4.5 meq/L (ref 3.7–5.3)
Sodium: 142 mEq/L (ref 137–147)

## 2014-09-28 LAB — CBC
HEMATOCRIT: 37.4 % — AB (ref 39.0–52.0)
HEMOGLOBIN: 12.3 g/dL — AB (ref 13.0–17.0)
MCH: 28.6 pg (ref 26.0–34.0)
MCHC: 32.9 g/dL (ref 30.0–36.0)
MCV: 87 fL (ref 78.0–100.0)
Platelets: 221 10*3/uL (ref 150–400)
RBC: 4.3 MIL/uL (ref 4.22–5.81)
RDW: 13.1 % (ref 11.5–15.5)
WBC: 5.1 10*3/uL (ref 4.0–10.5)

## 2014-09-28 LAB — PROTIME-INR
INR: 1.03 (ref 0.00–1.49)
Prothrombin Time: 13.6 seconds (ref 11.6–15.2)

## 2014-09-28 LAB — GLUCOSE, CAPILLARY: Glucose-Capillary: 126 mg/dL — ABNORMAL HIGH (ref 70–99)

## 2014-09-28 SURGERY — LEFT HEART CATHETERIZATION WITH CORONARY ANGIOGRAM
Anesthesia: LOCAL

## 2014-09-28 MED ORDER — MIDAZOLAM HCL 2 MG/2ML IJ SOLN
INTRAMUSCULAR | Status: AC
  Filled 2014-09-28: qty 2

## 2014-09-28 MED ORDER — SODIUM CHLORIDE 0.9 % IJ SOLN: 3.0000 mL | INTRAMUSCULAR | Status: DC | PRN

## 2014-09-28 MED ORDER — FENTANYL CITRATE 0.05 MG/ML IJ SOLN
INTRAMUSCULAR | Status: AC
  Filled 2014-09-28: qty 2

## 2014-09-28 MED ORDER — VERAPAMIL HCL 2.5 MG/ML IV SOLN
INTRAVENOUS | Status: AC
Start: 2014-09-28 — End: 2014-09-28
  Filled 2014-09-28: qty 2

## 2014-09-28 MED ORDER — LIDOCAINE HCL (PF) 1 % IJ SOLN
INTRAMUSCULAR | Status: AC
  Filled 2014-09-28: qty 30

## 2014-09-28 MED ORDER — SODIUM CHLORIDE 0.9 % IJ SOLN: 3.0000 mL | Freq: Two times a day (BID) | INTRAMUSCULAR | Status: DC

## 2014-09-28 MED ORDER — SODIUM CHLORIDE 0.9 % IV SOLN: 250.0000 mL | INTRAVENOUS | Status: DC | PRN

## 2014-09-28 MED ORDER — ASPIRIN 81 MG PO CHEW
CHEWABLE_TABLET | ORAL | Status: AC
  Filled 2014-09-28: qty 1

## 2014-09-28 MED ORDER — ASPIRIN 81 MG PO CHEW
81.0000 mg | CHEWABLE_TABLET | ORAL | Status: AC
  Administered 2014-09-28: 81 mg via ORAL

## 2014-09-28 MED ORDER — SODIUM CHLORIDE 0.9 % IV SOLN
INTRAVENOUS | Status: DC
  Administered 2014-09-28: 08:00:00 via INTRAVENOUS

## 2014-09-28 MED ORDER — SODIUM CHLORIDE 0.9 % IV SOLN: 1.0000 mL/kg/h | INTRAVENOUS | Status: DC

## 2014-09-28 MED ORDER — HEPARIN (PORCINE) IN NACL 2-0.9 UNIT/ML-% IJ SOLN
INTRAMUSCULAR | Status: AC
  Filled 2014-09-28: qty 1000

## 2014-09-28 MED ORDER — HEPARIN SODIUM (PORCINE) 1000 UNIT/ML IJ SOLN
INTRAMUSCULAR | Status: AC
  Filled 2014-09-28: qty 1

## 2014-09-28 NOTE — H&P (View-Only) (Signed)
Cardiology Office Note    Date:  09/25/2014   ID:  Yerik Zeringue, DOB August 19, 1946, MRN [id number]  PCP:  No primary care provider on file.  Cardiologist:  Dr. Tamala Julian      History of Present Illness: Ivan Simon is a 68 y.o. male with a history of IRBBB, HTN, prostate cancer s/p resection, lymphoma, OSA on CPAP, DM and HLD who was added on the clinic schedule today for evaluation of recurrent chest pain.   He was initially referred to cardiology after a primary care visit demonstrated an RSR prime in V1. He was recently diagnosed with diabetes. On therapy for hyperlipidemia. Also some labile blood pressures. 6-7 year history of exertional and nonexertional chest discomfort. Has had a normal coronary angiogram within the past 7 years. Is limited in physical activity by dyspnea. No prior history of smoking. He was seen by Dr. Tamala Julian on 08/20/14 who was suspicious that his elevated BPs were causing his dyspnea. He ordered a GXT for further work up. He underwent GXT on 09/18/14 which was essential normal; however, there were some non specific ECG changes and the patient developed chest pain. So it was decided to proceed with nuclear stress rest given his CV risk factors. He underwent nuclear stress test on 09/21/14 which returned normal. However, the patient called the office to report that he was having left arm and chest ache ever since treadmill, so he was added on to the [facility]LEX clinic schedule.   Today he complains of chest pain in his left chest that feels like a squeezing pressure than radiates from within his chest to the outside. It occurs everyday and lasts all day. He wakes up feeling okay but then as he gets moving the chest pain begins and comes and goes in intensity, but is always there. He also feels extremely SOB with minimal exertion and is constantly fatigued. He uses CPAP everyday night and all the equipment is functioning properly.   Studies:  - Nuclear (09/21/14): normal nuclear stress  study, EF 59%. NL LV function. NL wall motion     Recent Labs/Images:  No results for input(s): NA, K, BUN, CREATININE, ALT, HGB, TSH, LDLCALC, LDLDIRECT, HDL, BNP, PROBNP in the last 8760 hours.  Invalid input(s): LDL   No results found.   Wt Readings from Last 3 Encounters:  09/25/14 211 lb 6.4 oz (95.89 kg)  09/21/14 208 lb (94.348 kg)  08/20/14 211 lb 1.9 oz (95.763 kg)     Past Medical History  Diagnosis Date  . Cancer     prostate  . Lymphoma   . Elevated blood pressure 08/20/2014  . Prostate cancer 08/20/2014  . Diabetes mellitus type II, uncontrolled 08/20/2014  . Hyperlipidemia 08/20/2014    Current Outpatient Prescriptions  Medication Sig Dispense Refill  . cetirizine (ZYRTEC) 10 MG tablet Take 10 mg by mouth daily.    Marland Kitchen CINNAMON PO Take by mouth daily.    . fenofibrate 160 MG tablet Take 160 mg by mouth.    . metFORMIN (GLUCOPHAGE) 500 MG tablet Take 1,000 mg by mouth.    . Omega-3 Fatty Acids (FISH OIL) 1200 MG CAPS Take 1,200 mg by mouth 2 (two) times daily.    Marland Kitchen omeprazole (PRILOSEC) 20 MG capsule Take 20 mg by mouth.    . rosuvastatin (CRESTOR) 20 MG tablet Take 20 mg by mouth.     No current facility-administered medications for this visit.     Allergies:   Review of patient's allergies indicates  not on file.   Social History:  The patient  reports that he has never smoked. He does not have any smokeless tobacco history on file. He reports that he does not use illicit drugs.   Family History:  The patient's family history includes Hypertension in his sister; Stroke in his father. There is no history of Anemia, Arrhythmia, Asthma, Clotting disorder, Fainting, Heart attack, Heart disease, Heart failure, or Hyperlipidemia.   ROS:  Please see the history of present illness.  All other systems reviewed and negative.    PHYSICAL EXAM: VS:  BP 150/86 mmHg  Pulse 86  Ht 5\' 10"  (1.778 m)  Wt 211 lb 6.4 oz (95.89 kg)  BMI 30.33 kg/m2  SpO2 97% Well  nourished, well developed, in no acute distress HEENT: normal Neck: no JVD Cardiac:  normal S1, S2; RRR; no murmur Lungs: clear to auscultation bilaterally, no wheezing, rhonchi or rales Abd: soft, nontender, no hepatomegaly Ext: no edema Skin: warm and dry Neuro:  CNs 2-12 intact, no focal abnormalities noted  EKG:  None this visit      ASSESSMENT AND PLAN:  Ivan Simon is a 68 y.o. male with a history of IRBBB, HTN, prostate cancer s/p resection, lymphoma, OSA on CPAP, DM and HLD who was added on the clinic schedule today for evaluation of recurrent chest pain.  Recurrent chest pain- he continues to have squeezing chest pressure and dyspnea on minimal exertion. He does have significant CV risk factors including DM, HTN and HLD. I discussed the patient with his primary cardiologist, Dr. Tamala Julian. It was decided that we will need to proceed with LHC for definitive deliniation of his coronary anatomy.  -- This is set up for this Friday with Dr. Irish Lack. If no CAD, will focus on BP control and look for other causes of chest pain.  HTN- not on any BP agents currently. Did not tolerate Lopressor in the past. Today his pressure is 150/86. Consider adding an ACE in the setting of DM. Will defer this to after his heart catheterization.   HLD- continue statin, fish oil, and fenofibrate  DM- will hold metformin 24 hours before heart cath.    Disposition:  FU with APP or Dr. Tamala Julian in 2-3 weeks   Signed, Joen Laura, MHS 09/25/2014 2:27 PM    [address], [place], [place]  [address] Phone: [phone]; Fax: [phone]

## 2014-09-28 NOTE — Discharge Instructions (Signed)
Radial Site Care °Refer to this sheet in the next few weeks. These instructions provide you with information on caring for yourself after your procedure. Your caregiver may also give you more specific instructions. Your treatment has been planned according to current medical practices, but problems sometimes occur. Call your caregiver if you have any problems or questions after your procedure. °HOME CARE INSTRUCTIONS °· You may shower the day after the procedure. Remove the bandage (dressing) and gently wash the site with plain soap and water. Gently pat the site dry. °· Do not apply powder or lotion to the site. °· Do not submerge the affected site in water for 3 to 5 days. °· Inspect the site at least twice daily. °· Do not flex or bend the affected arm for 24 hours. °· No lifting over 5 pounds (2.3 kg) for 5 days after your procedure. °· Do not drive home if you are discharged the same day of the procedure. Have someone else drive you. °· You may drive 24 hours after the procedure unless otherwise instructed by your caregiver. °· Do not operate machinery or power tools for 24 hours. °· A responsible adult should be with you for the first 24 hours after you arrive home. °What to expect: °· Any bruising will usually fade within 1 to 2 weeks. °· Blood that collects in the tissue (hematoma) may be painful to the touch. It should usually decrease in size and tenderness within 1 to 2 weeks. °SEEK IMMEDIATE MEDICAL CARE IF: °· You have unusual pain at the radial site. °· You have redness, warmth, swelling, or pain at the radial site. °· You have drainage (other than a small amount of blood on the dressing). °· You have chills. °· You have a fever or persistent symptoms for more than 72 hours. °· You have a fever and your symptoms suddenly get worse. °· Your arm becomes pale, cool, tingly, or numb. °· You have heavy bleeding from the site. Hold pressure on the site. °Document Released: 11/07/2010 Document Revised:  12/28/2011 Document Reviewed: 11/07/2010 °ExitCare® Patient Information ©2015 [employer]. This information is not intended to replace advice given to you by your health care provider. Make sure you discuss any questions you have with your health care provider. ° °

## 2014-09-28 NOTE — CV Procedure (Signed)
       PROCEDURE:  Left heart catheterization with selective coronary angiography, left ventriculogram.  INDICATIONS:  Persistent chest pain  The risks, benefits, and details of the procedure were explained to the patient.  The patient verbalized understanding and wanted to proceed.  Informed written consent was obtained.  PROCEDURE TECHNIQUE:  After Xylocaine anesthesia a 34F slender sheath was placed in the right radial artery with a single anterior needle wall stick.   IV Heparin was given.  Right coronary angiography was done using a Judkins R4 guide catheter.  Left coronary angiography was done using a Judkins L3.5 guide catheter.  Left ventriculography was done using a pigtail catheter.  A TR band was used for hemostasis.   CONTRAST:  Total of 60 cc.  COMPLICATIONS:  None.    HEMODYNAMICS:  Aortic pressure was 108/66; LV pressure was 109/9; LVEDP 14.  There was no gradient between the left ventricle and aorta.    ANGIOGRAPHIC DATA:   The left main coronary artery is widely patent.  The left anterior descending artery is  the large vessel which wraps around the apex. There is a large diagonal which is patent .  There is a section of the LAD which may be intramyocardial.   The left circumflex artery is a medium-size vessel which is widely patent.  The right coronary artery is  a large dominant vessel which is widely patent. The posterior descending artery is large and patent. The posterior lateral artery is medium sized and patent.  LEFT VENTRICULOGRAM:  Left ventricular angiogram was done in the 30 RAO projection and revealed normal left ventricular wall motion and systolic function with an estimated ejection fraction of 60%.  LVEDP was 14 mmHg.  IMPRESSIONS:  1. Normal left main coronary artery. 2. Normal left anterior descending artery and its branches. 3. Normal left circumflex artery and its branches. 4. Normal right coronary artery. 5. Normal left ventricular systolic  function.  LVEDP 14 mmHg.  Ejection fraction 60%.  RECOMMENDATION:  Continue aggressive preventative therapy.  follow-up with Dr. Tamala Julian.

## 2014-09-28 NOTE — Interval H&P Note (Signed)
Cath Lab Visit (complete for each Cath Lab visit)  Clinical Evaluation Leading to the Procedure:   ACS: No.  Non-ACS:    Anginal Classification: CCS III  Anti-ischemic medical therapy: No Therapy  Non-Invasive Test Results: Low-risk stress test findings: cardiac mortality <1%/year  Prior CABG: No previous CABG   Ischemic Symptoms? CCS III (Marked limitation of ordinary activity) Anti-ischemic Medical Therapy? No Therapy Non-invasive Test Results? No non-invasive testing performed Prior CABG? No Previous CABG   Patient Information:   1-2V CAD, no prox LAD  A (7)  Indication: 20; Score: 7   Patient Information:   1-2V-CAD with DS 50-60% With No FFR, No IVUS  I (3)  Indication: 21; Score: 3   Patient Information:   1-2V-CAD with DS 50-60% With FFR  A (7)  Indication: 22; Score: 7   Patient Information:   1-2V-CAD with DS 50-60% With FFR>0.8, IVUS not significant  I (2)  Indication: 23; Score: 2   Patient Information:   3V-CAD without LMCA With Abnormal LV systolic function  A (9)  Indication: 48; Score: 9   Patient Information:   LMCA-CAD  A (9)  Indication: 49; Score: 9   Patient Information:   2V-CAD with prox LAD PCI  A (7)  Indication: 62; Score: 7   Patient Information:   2V-CAD with prox LAD CABG  A (8)  Indication: 62; Score: 8   Patient Information:   3V-CAD without LMCA With Low CAD burden(i.e., 3 focal stenoses, low SYNTAX score) PCI  A (7)  Indication: 63; Score: 7   Patient Information:   3V-CAD without LMCA With Low CAD burden(i.e., 3 focal stenoses, low SYNTAX score) CABG  A (9)  Indication: 63; Score: 9   Patient Information:   3V-CAD without LMCA E06c - Intermediate-high CAD burden (i.e., multiple diffuse lesions, presence of CTO, or high SYNTAX score) PCI  U (4)  Indication: 64; Score: 4   Patient Information:   3V-CAD without LMCA E06c - Intermediate-high CAD burden (i.e., multiple  diffuse lesions, presence of CTO, or high SYNTAX score) CABG  A (9)  Indication: 64; Score: 9   Patient Information:   LMCA-CAD With Isolated LMCA stenosis  PCI  U (6)  Indication: 65; Score: 6   Patient Information:   LMCA-CAD With Isolated LMCA stenosis  CABG  A (9)  Indication: 65; Score: 9   Patient Information:   LMCA-CAD Additional CAD, low CAD burden (i.e., 1- to 2-vessel additional involvement, low SYNTAX score) PCI  U (5)  Indication: 66; Score: 5   Patient Information:   LMCA-CAD Additional CAD, low CAD burden (i.e., 1- to 2-vessel additional involvement, low SYNTAX score) CABG  A (9)  Indication: 66; Score: 9   Patient Information:   LMCA-CAD Additional CAD, intermediate-high CAD burden (i.e., 3-vessel involvement, presence of CTO, or high SYNTAX score) PCI  I (3)  Indication: 67; Score: 3   Patient Information:   LMCA-CAD Additional CAD, intermediate-high CAD burden (i.e., 3-vessel involvement, presence of CTO, or high SYNTAX score) CABG  A (9)  Indication: 67; Score: 9    History and Physical Interval Note:  09/28/2014 11:21 AM  Alethia Berthold  has presented today for surgery, with the diagnosis of cp  The various methods of treatment have been discussed with the patient and family. After consideration of risks, benefits and other options for treatment, the patient has consented to  Procedure(s): LEFT HEART CATHETERIZATION WITH CORONARY ANGIOGRAM (N/A) as a surgical intervention .  The  patient's history has been reviewed, patient examined, no change in status, stable for surgery.  I have reviewed the patient's chart and labs.  Questions were answered to the patient's satisfaction.     VARANASI,JAYADEEP S.

## 2014-10-03 ENCOUNTER — Other Ambulatory Visit: Payer: Self-pay | Admitting: Interventional Cardiology

## 2014-10-03 DIAGNOSIS — I209 Angina pectoris, unspecified: Secondary | ICD-10-CM

## 2014-10-29 ENCOUNTER — Encounter: Payer: Self-pay | Admitting: Interventional Cardiology

## 2014-10-29 ENCOUNTER — Ambulatory Visit (INDEPENDENT_AMBULATORY_CARE_PROVIDER_SITE_OTHER): Payer: PPO | Admitting: Interventional Cardiology

## 2014-10-29 VITALS — BP 122/76 | HR 81 | Ht 70.0 in | Wt 221.0 lb

## 2014-10-29 DIAGNOSIS — R030 Elevated blood-pressure reading, without diagnosis of hypertension: Secondary | ICD-10-CM

## 2014-10-29 DIAGNOSIS — R079 Chest pain, unspecified: Secondary | ICD-10-CM

## 2014-10-29 DIAGNOSIS — IMO0002 Reserved for concepts with insufficient information to code with codable children: Secondary | ICD-10-CM

## 2014-10-29 DIAGNOSIS — E1165 Type 2 diabetes mellitus with hyperglycemia: Secondary | ICD-10-CM

## 2014-10-29 DIAGNOSIS — I4510 Unspecified right bundle-branch block: Secondary | ICD-10-CM

## 2014-10-29 DIAGNOSIS — I450 Right fascicular block: Secondary | ICD-10-CM

## 2014-10-29 DIAGNOSIS — IMO0001 Reserved for inherently not codable concepts without codable children: Secondary | ICD-10-CM

## 2014-10-29 DIAGNOSIS — E785 Hyperlipidemia, unspecified: Secondary | ICD-10-CM

## 2014-10-29 MED ORDER — PANTOPRAZOLE SODIUM 40 MG PO TBEC: 40.0000 mg | DELAYED_RELEASE_TABLET | Freq: Every day | ORAL | Status: AC

## 2014-10-29 NOTE — Progress Notes (Signed)
Patient ID: Ivan Simon, male   DOB: [birth year], 68 y.o.   MRN: [id number]    [address] [place], [place]  [address] Phone: [phone] Fax:  [phone]  Date:  10/29/2014   ID:  Ivan Simon, DOB 05-18-1946, MRN [id number]  PCP:  Sinclair Grooms, MD   ASSESSMENT:  1. Recurring chest discomfort, highly likely to be related to reflux 2. Recent torn area angiogram with widely patent coronaries 3. History of Barrett's esophagus  PLAN:  1. Discontinue Prilosec and start Protonix 40 mg per day 2 months 2. No cardiac follow-up as needed   SUBJECTIVE: Ivan Simon is a 69 y.o. male who is doing well. Coronary angiogram demonstrated no evidence of obstructive disease. He continues to have discomfort. He worse he may have gallbladder disease. He has known reflux with Barrett's esophagus.   Wt Readings from Last 3 Encounters:  10/29/14 221 lb (100.245 kg)  09/28/14 207 lb (93.895 kg)  09/25/14 211 lb 6.4 oz (95.89 kg)     Past Medical History  Diagnosis Date  . Cancer     prostate  . Lymphoma   . Elevated blood pressure 08/20/2014  . Prostate cancer 08/20/2014  . Diabetes mellitus type II, uncontrolled 08/20/2014  . Hyperlipidemia 08/20/2014  . Chest pain     Current Outpatient Prescriptions  Medication Sig Dispense Refill  . cetirizine (ZYRTEC) 10 MG tablet Take 10 mg by mouth daily.    Marland Kitchen CINNAMON PO Take by mouth daily.    . fenofibrate 160 MG tablet Take 160 mg by mouth.    . metFORMIN (GLUCOPHAGE) 500 MG tablet Take 1,000 mg by mouth. Take two tablets twice each day.    . Omega-3 Fatty Acids (FISH OIL) 1200 MG CAPS Take 1,200 mg by mouth 2 (two) times daily.    Marland Kitchen omeprazole (PRILOSEC) 20 MG capsule Take 20 mg by mouth.    . rosuvastatin (CRESTOR) 20 MG tablet Take 20 mg by mouth.     No current facility-administered medications for this visit.    Allergies:   No Known Allergies  Social History:  The patient  reports that he has never smoked. He  does not have any smokeless tobacco history on file. He reports that he does not use illicit drugs.   ROS:  Please see the history of present illness.   Denies dyspnea and exertional discomfort   All other systems reviewed and negative.   OBJECTIVE: VS:  BP 122/76 mmHg  Pulse 81  Ht 5\' 10"  (1.778 m)  Wt 221 lb (100.245 kg)  BMI 31.71 kg/m2 Well nourished, well developed, in no acute distress, obese HEENT: normal Neck: JVD flat. Carotid bruit absent  Cardiac:  normal S1, S2; RRR; no murmur Lungs:  clear to auscultation bilaterally, no wheezing, rhonchi or rales Abd: soft, nontender, no hepatomegaly Ext: Edema absent. Pulses 2+ Skin: warm and dry Neuro:  CNs 2-12 intact, no focal abnormalities noted  EKG:  Not performed       Signed, Illene Labrador III, MD 10/29/2014 10:18 AM

## 2014-10-29 NOTE — Patient Instructions (Signed)
Your physician has recommended you make the following change in your medication:  1) START Protonix 40mg  daily  Your physician recommends that you schedule a follow-up appointment as needed

## 2015-12-26 DIAGNOSIS — E119 Type 2 diabetes mellitus without complications: Secondary | ICD-10-CM | POA: Diagnosis not present

## 2015-12-27 DIAGNOSIS — G4761 Periodic limb movement disorder: Secondary | ICD-10-CM | POA: Diagnosis not present

## 2015-12-27 DIAGNOSIS — E782 Mixed hyperlipidemia: Secondary | ICD-10-CM | POA: Diagnosis not present

## 2015-12-27 DIAGNOSIS — K580 Irritable bowel syndrome with diarrhea: Secondary | ICD-10-CM | POA: Diagnosis not present

## 2015-12-27 DIAGNOSIS — E119 Type 2 diabetes mellitus without complications: Secondary | ICD-10-CM | POA: Diagnosis not present

## 2016-03-31 DIAGNOSIS — E119 Type 2 diabetes mellitus without complications: Secondary | ICD-10-CM | POA: Diagnosis not present

## 2016-03-31 DIAGNOSIS — J209 Acute bronchitis, unspecified: Secondary | ICD-10-CM | POA: Diagnosis not present

## 2016-03-31 DIAGNOSIS — J301 Allergic rhinitis due to pollen: Secondary | ICD-10-CM | POA: Diagnosis not present

## 2016-09-26 DIAGNOSIS — R05 Cough: Secondary | ICD-10-CM | POA: Diagnosis not present

## 2017-12-31 DIAGNOSIS — G4733 Obstructive sleep apnea (adult) (pediatric): Secondary | ICD-10-CM | POA: Diagnosis not present

## 2017-12-31 DIAGNOSIS — Z1389 Encounter for screening for other disorder: Secondary | ICD-10-CM | POA: Diagnosis not present

## 2017-12-31 DIAGNOSIS — K2270 Barrett's esophagus without dysplasia: Secondary | ICD-10-CM | POA: Diagnosis not present

## 2017-12-31 DIAGNOSIS — Z7984 Long term (current) use of oral hypoglycemic drugs: Secondary | ICD-10-CM | POA: Diagnosis not present

## 2017-12-31 DIAGNOSIS — J309 Allergic rhinitis, unspecified: Secondary | ICD-10-CM | POA: Diagnosis not present

## 2017-12-31 DIAGNOSIS — Z125 Encounter for screening for malignant neoplasm of prostate: Secondary | ICD-10-CM | POA: Diagnosis not present

## 2017-12-31 DIAGNOSIS — K219 Gastro-esophageal reflux disease without esophagitis: Secondary | ICD-10-CM | POA: Diagnosis not present

## 2017-12-31 DIAGNOSIS — E119 Type 2 diabetes mellitus without complications: Secondary | ICD-10-CM | POA: Diagnosis not present

## 2017-12-31 DIAGNOSIS — C8599 Non-Hodgkin lymphoma, unspecified, extranodal and solid organ sites: Secondary | ICD-10-CM | POA: Diagnosis not present

## 2017-12-31 DIAGNOSIS — E7800 Pure hypercholesterolemia, unspecified: Secondary | ICD-10-CM | POA: Diagnosis not present

## 2017-12-31 DIAGNOSIS — C439 Malignant melanoma of skin, unspecified: Secondary | ICD-10-CM | POA: Diagnosis not present

## 2017-12-31 DIAGNOSIS — Z8546 Personal history of malignant neoplasm of prostate: Secondary | ICD-10-CM | POA: Diagnosis not present

## 2017-12-31 DIAGNOSIS — C4491 Basal cell carcinoma of skin, unspecified: Secondary | ICD-10-CM | POA: Diagnosis not present

## 2018-02-09 DIAGNOSIS — E1122 Type 2 diabetes mellitus with diabetic chronic kidney disease: Secondary | ICD-10-CM | POA: Diagnosis not present

## 2018-02-09 DIAGNOSIS — K219 Gastro-esophageal reflux disease without esophagitis: Secondary | ICD-10-CM | POA: Diagnosis not present

## 2018-02-09 DIAGNOSIS — N183 Chronic kidney disease, stage 3 (moderate): Secondary | ICD-10-CM | POA: Diagnosis not present

## 2018-02-09 DIAGNOSIS — E7800 Pure hypercholesterolemia, unspecified: Secondary | ICD-10-CM | POA: Diagnosis not present

## 2018-02-09 DIAGNOSIS — Z23 Encounter for immunization: Secondary | ICD-10-CM | POA: Diagnosis not present

## 2018-02-09 DIAGNOSIS — G4733 Obstructive sleep apnea (adult) (pediatric): Secondary | ICD-10-CM | POA: Diagnosis not present

## 2018-02-09 DIAGNOSIS — C61 Malignant neoplasm of prostate: Secondary | ICD-10-CM | POA: Diagnosis not present

## 2018-02-09 DIAGNOSIS — Z1389 Encounter for screening for other disorder: Secondary | ICD-10-CM | POA: Diagnosis not present

## 2018-02-09 DIAGNOSIS — E119 Type 2 diabetes mellitus without complications: Secondary | ICD-10-CM | POA: Diagnosis not present

## 2018-02-09 DIAGNOSIS — C8599 Non-Hodgkin lymphoma, unspecified, extranodal and solid organ sites: Secondary | ICD-10-CM | POA: Diagnosis not present

## 2018-02-09 DIAGNOSIS — Z0000 Encounter for general adult medical examination without abnormal findings: Secondary | ICD-10-CM | POA: Diagnosis not present

## 2018-02-09 DIAGNOSIS — C439 Malignant melanoma of skin, unspecified: Secondary | ICD-10-CM | POA: Diagnosis not present

## 2018-02-09 DIAGNOSIS — K2270 Barrett's esophagus without dysplasia: Secondary | ICD-10-CM | POA: Diagnosis not present

## 2018-02-09 DIAGNOSIS — C4491 Basal cell carcinoma of skin, unspecified: Secondary | ICD-10-CM | POA: Diagnosis not present

## 2018-08-23 DIAGNOSIS — C61 Malignant neoplasm of prostate: Secondary | ICD-10-CM | POA: Diagnosis not present

## 2018-08-23 DIAGNOSIS — E1122 Type 2 diabetes mellitus with diabetic chronic kidney disease: Secondary | ICD-10-CM | POA: Diagnosis not present

## 2018-08-23 DIAGNOSIS — K219 Gastro-esophageal reflux disease without esophagitis: Secondary | ICD-10-CM | POA: Diagnosis not present

## 2018-08-23 DIAGNOSIS — Z23 Encounter for immunization: Secondary | ICD-10-CM | POA: Diagnosis not present

## 2018-08-23 DIAGNOSIS — E7800 Pure hypercholesterolemia, unspecified: Secondary | ICD-10-CM | POA: Diagnosis not present

## 2018-08-23 DIAGNOSIS — N183 Chronic kidney disease, stage 3 (moderate): Secondary | ICD-10-CM | POA: Diagnosis not present

## 2019-02-14 DIAGNOSIS — J309 Allergic rhinitis, unspecified: Secondary | ICD-10-CM | POA: Diagnosis not present

## 2019-02-14 DIAGNOSIS — K2270 Barrett's esophagus without dysplasia: Secondary | ICD-10-CM | POA: Diagnosis not present

## 2019-02-14 DIAGNOSIS — N183 Chronic kidney disease, stage 3 (moderate): Secondary | ICD-10-CM | POA: Diagnosis not present

## 2019-02-14 DIAGNOSIS — Z1389 Encounter for screening for other disorder: Secondary | ICD-10-CM | POA: Diagnosis not present

## 2019-02-14 DIAGNOSIS — E1122 Type 2 diabetes mellitus with diabetic chronic kidney disease: Secondary | ICD-10-CM | POA: Diagnosis not present

## 2019-02-14 DIAGNOSIS — C4491 Basal cell carcinoma of skin, unspecified: Secondary | ICD-10-CM | POA: Diagnosis not present

## 2019-02-14 DIAGNOSIS — C439 Malignant melanoma of skin, unspecified: Secondary | ICD-10-CM | POA: Diagnosis not present

## 2019-02-14 DIAGNOSIS — Z0000 Encounter for general adult medical examination without abnormal findings: Secondary | ICD-10-CM | POA: Diagnosis not present

## 2019-02-14 DIAGNOSIS — G4733 Obstructive sleep apnea (adult) (pediatric): Secondary | ICD-10-CM | POA: Diagnosis not present

## 2019-02-14 DIAGNOSIS — C8599 Non-Hodgkin lymphoma, unspecified, extranodal and solid organ sites: Secondary | ICD-10-CM | POA: Diagnosis not present

## 2019-02-14 DIAGNOSIS — E7800 Pure hypercholesterolemia, unspecified: Secondary | ICD-10-CM | POA: Diagnosis not present

## 2019-02-14 DIAGNOSIS — C61 Malignant neoplasm of prostate: Secondary | ICD-10-CM | POA: Diagnosis not present

## 2019-02-15 DIAGNOSIS — E1122 Type 2 diabetes mellitus with diabetic chronic kidney disease: Secondary | ICD-10-CM | POA: Diagnosis not present

## 2019-02-15 DIAGNOSIS — Z7984 Long term (current) use of oral hypoglycemic drugs: Secondary | ICD-10-CM | POA: Diagnosis not present

## 2019-02-15 DIAGNOSIS — Z1159 Encounter for screening for other viral diseases: Secondary | ICD-10-CM | POA: Diagnosis not present

## 2019-02-15 DIAGNOSIS — C61 Malignant neoplasm of prostate: Secondary | ICD-10-CM | POA: Diagnosis not present

## 2019-09-27 DIAGNOSIS — H903 Sensorineural hearing loss, bilateral: Secondary | ICD-10-CM | POA: Diagnosis not present

## 2019-10-06 DIAGNOSIS — E785 Hyperlipidemia, unspecified: Secondary | ICD-10-CM | POA: Diagnosis not present

## 2019-10-06 DIAGNOSIS — E1169 Type 2 diabetes mellitus with other specified complication: Secondary | ICD-10-CM | POA: Diagnosis not present

## 2019-10-06 DIAGNOSIS — K219 Gastro-esophageal reflux disease without esophagitis: Secondary | ICD-10-CM | POA: Diagnosis not present

## 2019-10-06 DIAGNOSIS — E669 Obesity, unspecified: Secondary | ICD-10-CM | POA: Diagnosis not present

## 2019-10-06 DIAGNOSIS — Z6832 Body mass index (BMI) 32.0-32.9, adult: Secondary | ICD-10-CM | POA: Diagnosis not present

## 2019-10-06 DIAGNOSIS — R030 Elevated blood-pressure reading, without diagnosis of hypertension: Secondary | ICD-10-CM | POA: Diagnosis not present

## 2019-10-06 DIAGNOSIS — Z008 Encounter for other general examination: Secondary | ICD-10-CM | POA: Diagnosis not present

## 2019-10-06 DIAGNOSIS — Z0000 Encounter for general adult medical examination without abnormal findings: Secondary | ICD-10-CM | POA: Diagnosis not present

## 2019-10-06 DIAGNOSIS — Z7984 Long term (current) use of oral hypoglycemic drugs: Secondary | ICD-10-CM | POA: Diagnosis not present

## 2019-10-31 DIAGNOSIS — R030 Elevated blood-pressure reading, without diagnosis of hypertension: Secondary | ICD-10-CM | POA: Diagnosis not present

## 2019-10-31 DIAGNOSIS — Z008 Encounter for other general examination: Secondary | ICD-10-CM | POA: Diagnosis not present

## 2019-10-31 DIAGNOSIS — E1169 Type 2 diabetes mellitus with other specified complication: Secondary | ICD-10-CM | POA: Diagnosis not present

## 2019-10-31 DIAGNOSIS — R600 Localized edema: Secondary | ICD-10-CM | POA: Diagnosis not present

## 2019-10-31 DIAGNOSIS — E669 Obesity, unspecified: Secondary | ICD-10-CM | POA: Diagnosis not present

## 2019-10-31 DIAGNOSIS — E785 Hyperlipidemia, unspecified: Secondary | ICD-10-CM | POA: Diagnosis not present

## 2019-10-31 DIAGNOSIS — J309 Allergic rhinitis, unspecified: Secondary | ICD-10-CM | POA: Diagnosis not present

## 2019-10-31 DIAGNOSIS — Z0000 Encounter for general adult medical examination without abnormal findings: Secondary | ICD-10-CM | POA: Diagnosis not present

## 2019-10-31 DIAGNOSIS — M25512 Pain in left shoulder: Secondary | ICD-10-CM | POA: Diagnosis not present

## 2019-10-31 DIAGNOSIS — Z0001 Encounter for general adult medical examination with abnormal findings: Secondary | ICD-10-CM | POA: Diagnosis not present

## 2019-10-31 DIAGNOSIS — K219 Gastro-esophageal reflux disease without esophagitis: Secondary | ICD-10-CM | POA: Diagnosis not present

## 2019-10-31 DIAGNOSIS — H9190 Unspecified hearing loss, unspecified ear: Secondary | ICD-10-CM | POA: Diagnosis not present

## 2019-10-31 DIAGNOSIS — E1142 Type 2 diabetes mellitus with diabetic polyneuropathy: Secondary | ICD-10-CM | POA: Diagnosis not present

## 2020-04-26 DIAGNOSIS — R600 Localized edema: Secondary | ICD-10-CM | POA: Diagnosis not present

## 2020-04-26 DIAGNOSIS — Z6833 Body mass index (BMI) 33.0-33.9, adult: Secondary | ICD-10-CM | POA: Diagnosis not present

## 2020-04-26 DIAGNOSIS — H409 Unspecified glaucoma: Secondary | ICD-10-CM | POA: Diagnosis not present

## 2020-04-26 DIAGNOSIS — E1169 Type 2 diabetes mellitus with other specified complication: Secondary | ICD-10-CM | POA: Diagnosis not present

## 2020-04-26 DIAGNOSIS — Z0000 Encounter for general adult medical examination without abnormal findings: Secondary | ICD-10-CM | POA: Diagnosis not present

## 2020-04-26 DIAGNOSIS — R030 Elevated blood-pressure reading, without diagnosis of hypertension: Secondary | ICD-10-CM | POA: Diagnosis not present

## 2020-04-26 DIAGNOSIS — Z7984 Long term (current) use of oral hypoglycemic drugs: Secondary | ICD-10-CM | POA: Diagnosis not present

## 2020-04-26 DIAGNOSIS — E669 Obesity, unspecified: Secondary | ICD-10-CM | POA: Diagnosis not present

## 2020-04-26 DIAGNOSIS — Z85820 Personal history of malignant melanoma of skin: Secondary | ICD-10-CM | POA: Diagnosis not present

## 2020-04-26 DIAGNOSIS — Z008 Encounter for other general examination: Secondary | ICD-10-CM | POA: Diagnosis not present

## 2020-05-28 DIAGNOSIS — E119 Type 2 diabetes mellitus without complications: Secondary | ICD-10-CM | POA: Diagnosis not present

## 2020-07-29 DIAGNOSIS — M25512 Pain in left shoulder: Secondary | ICD-10-CM | POA: Diagnosis not present

## 2020-07-29 DIAGNOSIS — Z0001 Encounter for general adult medical examination with abnormal findings: Secondary | ICD-10-CM | POA: Diagnosis not present

## 2020-07-29 DIAGNOSIS — Z9989 Dependence on other enabling machines and devices: Secondary | ICD-10-CM | POA: Diagnosis not present

## 2020-07-29 DIAGNOSIS — E1169 Type 2 diabetes mellitus with other specified complication: Secondary | ICD-10-CM | POA: Diagnosis not present

## 2020-07-29 DIAGNOSIS — Z6833 Body mass index (BMI) 33.0-33.9, adult: Secondary | ICD-10-CM | POA: Diagnosis not present

## 2020-07-29 DIAGNOSIS — Z7984 Long term (current) use of oral hypoglycemic drugs: Secondary | ICD-10-CM | POA: Diagnosis not present

## 2020-07-29 DIAGNOSIS — Z008 Encounter for other general examination: Secondary | ICD-10-CM | POA: Diagnosis not present

## 2020-07-29 DIAGNOSIS — R600 Localized edema: Secondary | ICD-10-CM | POA: Diagnosis not present

## 2020-07-29 DIAGNOSIS — R030 Elevated blood-pressure reading, without diagnosis of hypertension: Secondary | ICD-10-CM | POA: Diagnosis not present

## 2020-07-29 DIAGNOSIS — G4733 Obstructive sleep apnea (adult) (pediatric): Secondary | ICD-10-CM | POA: Diagnosis not present

## 2020-07-29 DIAGNOSIS — E669 Obesity, unspecified: Secondary | ICD-10-CM | POA: Diagnosis not present

## 2020-12-27 DIAGNOSIS — K219 Gastro-esophageal reflux disease without esophagitis: Secondary | ICD-10-CM | POA: Diagnosis not present

## 2020-12-27 DIAGNOSIS — E119 Type 2 diabetes mellitus without complications: Secondary | ICD-10-CM | POA: Diagnosis not present

## 2020-12-27 DIAGNOSIS — E7800 Pure hypercholesterolemia, unspecified: Secondary | ICD-10-CM | POA: Diagnosis not present

## 2020-12-27 DIAGNOSIS — G4733 Obstructive sleep apnea (adult) (pediatric): Secondary | ICD-10-CM | POA: Diagnosis not present

## 2020-12-27 DIAGNOSIS — R011 Cardiac murmur, unspecified: Secondary | ICD-10-CM | POA: Diagnosis not present

## 2020-12-27 DIAGNOSIS — Z9989 Dependence on other enabling machines and devices: Secondary | ICD-10-CM | POA: Diagnosis not present

## 2020-12-27 DIAGNOSIS — Z7984 Long term (current) use of oral hypoglycemic drugs: Secondary | ICD-10-CM | POA: Diagnosis not present

## 2020-12-27 DIAGNOSIS — E782 Mixed hyperlipidemia: Secondary | ICD-10-CM | POA: Diagnosis not present

## 2020-12-28 DIAGNOSIS — E782 Mixed hyperlipidemia: Secondary | ICD-10-CM | POA: Diagnosis not present

## 2020-12-28 DIAGNOSIS — E7800 Pure hypercholesterolemia, unspecified: Secondary | ICD-10-CM | POA: Diagnosis not present

## 2020-12-28 DIAGNOSIS — K219 Gastro-esophageal reflux disease without esophagitis: Secondary | ICD-10-CM | POA: Diagnosis not present

## 2020-12-28 DIAGNOSIS — Z125 Encounter for screening for malignant neoplasm of prostate: Secondary | ICD-10-CM | POA: Diagnosis not present

## 2020-12-28 DIAGNOSIS — I10 Essential (primary) hypertension: Secondary | ICD-10-CM | POA: Diagnosis not present

## 2020-12-28 DIAGNOSIS — Z7984 Long term (current) use of oral hypoglycemic drugs: Secondary | ICD-10-CM | POA: Diagnosis not present

## 2020-12-28 DIAGNOSIS — E119 Type 2 diabetes mellitus without complications: Secondary | ICD-10-CM | POA: Diagnosis not present

## 2021-01-19 DIAGNOSIS — M25532 Pain in left wrist: Secondary | ICD-10-CM | POA: Diagnosis not present

## 2021-01-19 DIAGNOSIS — S52123A Displaced fracture of head of unspecified radius, initial encounter for closed fracture: Secondary | ICD-10-CM | POA: Diagnosis not present

## 2021-01-20 ENCOUNTER — Ambulatory Visit: Payer: PPO | Admitting: Orthopedic Surgery

## 2021-01-20 ENCOUNTER — Encounter: Payer: Self-pay | Admitting: Orthopedic Surgery

## 2021-01-20 DIAGNOSIS — S52532A Colles' fracture of left radius, initial encounter for closed fracture: Secondary | ICD-10-CM | POA: Diagnosis not present

## 2021-01-20 MED ORDER — HYDROCODONE-ACETAMINOPHEN 5-325 MG PO TABS: 1.0000 | ORAL_TABLET | ORAL | 0 refills | Status: AC | PRN

## 2021-01-20 MED ORDER — HYDROCODONE-ACETAMINOPHEN 5-325 MG PO TABS: 1.0000 | ORAL_TABLET | ORAL | 0 refills | Status: DC | PRN

## 2021-01-20 NOTE — Addendum Note (Signed)
Addended by: Meridee Score on: 01/20/2021 04:18 PM   Modules accepted: Orders

## 2021-01-20 NOTE — Progress Notes (Signed)
Office Visit Note   Patient: Ivan Simon           Date of Birth: 1946/05/18           MRN: [id number] Visit Date: 01/20/2021              Requested by: Belva Crome, [place] [address] [place],  [place] [address] PCP: Belva Crome, MD  Chief Complaint  Patient presents with  . Left Wrist - Pain    DOI 01/19/21 fall      HPI: Patient is a 75 year old gentleman is seen for initial evaluation for left distal radius metaphyseal fracture that he sustained yesterday he fell backwards on an outstretched hand had immediate onset of pain he was placed in a Velcro splint and is seen today for initial evaluation.  Assessment & Plan: Visit Diagnoses:  1. Closed Colles' fracture of left radius, initial encounter     Plan: Discussed operative versus nonoperative treatment discussed the importance of elevation ice avoiding supination and pronation of the wrist follow-up in 2 weeks with repeat radiographs.  Discussed that if patient changes his mind and wishes to proceed with surgical intervention we could set that up at his convenience with Dr.Xu   Follow-Up Instructions: Return in about 2 weeks (around 02/03/2021).   Ortho Exam  Patient is alert, oriented, no adenopathy, well-dressed, normal affect, normal respiratory effort. Examination patient does not have any fracture blisters there is minimal swelling he does have some numbness and tingling into the ring and long finger but the sensation is intact in all digits.  He is full range of motion of all fingers.  Review of the radiographs shows a metaphyseal fracture with slight dorsal angulation and a bony spur volarly.  Discussed risk of the volar spur causing problems with the median nerve or flexor tendons.  Imaging: No results found. No images are attached to the encounter.  Labs: No results found for: HGBA1C, ESRSEDRATE, CRP, LABURIC, REPTSTATUS, GRAMSTAIN, CULT, LABORGA   No results found for: ALBUMIN,  PREALBUMIN, CBC  No results found for: MG No results found for: VD25OH  No results found for: PREALBUMIN CBC EXTENDED Latest Ref Rng & Units 09/28/2014 12/14/2008 12/13/2008  WBC 4.0 - 10.5 K/uL 5.1 9.8 -  RBC 4.22 - 5.81 MIL/uL 4.30 3.95(L) -  HGB 13.0 - 17.0 g/dL 12.3(L) 11.3(L) 11.8(L)  HCT 39.0 - 52.0 % 37.4(L) 34.5(L) 35.2(L)  PLT 150 - 400 K/uL 221 175 -     There is no height or weight on file to calculate BMI.  Orders:  No orders of the defined types were placed in this encounter.  No orders of the defined types were placed in this encounter.    Procedures: No procedures performed  Clinical Data: No additional findings.  ROS:  All other systems negative, except as noted in the HPI. Review of Systems  Objective: Vital Signs: There were no vitals taken for this visit.  Specialty Comments:  No specialty comments available.  PMFS History: Patient Active Problem List   Diagnosis Date Noted  . Chest pain   . Elevated blood pressure 08/20/2014  . Prostate cancer ([place]) 08/20/2014  . 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 Diabetes mellitus type II, uncontrolled ([place]) 08/20/2014  . Hyperlipidemia 08/20/2014   Past Medical History:  Diagnosis Date  . Cancer [facility])    prostate  . Chest pain   . Diabetes mellitus type II, uncontrolled ([place]) 08/20/2014  .  Elevated blood pressure 08/20/2014  . Hyperlipidemia 08/20/2014  . Lymphoma ([place])   . Prostate cancer ([place]) 08/20/2014    Family History  Problem Relation Age of Onset  . Stroke Father   . Hypertension Sister   . Anemia Neg Hx   . Arrhythmia Neg Hx   . Asthma Neg Hx   . Clotting disorder Neg Hx   . Fainting Neg Hx   . Heart attack Neg Hx   . Heart disease Neg Hx   . Heart failure Neg Hx   . Hyperlipidemia Neg Hx     Past Surgical History:  Procedure Laterality Date  . BACK SURGERY     removal of lymphoma  . LEFT HEART CATHETERIZATION WITH CORONARY ANGIOGRAM N/A 09/28/2014   Procedure:  LEFT HEART CATHETERIZATION WITH CORONARY ANGIOGRAM;  Surgeon: Jettie Booze, MD;  Location: [facility] CATH LAB;  Service: Cardiovascular;  Laterality: N/A;  . PROSTATECTOMY     Social History   Occupational History  . Not on file  Tobacco Use  . Smoking status: Never Smoker  . Smokeless tobacco: Not on file  Substance and Sexual Activity  . Alcohol use: Not on file  . Drug use: No  . Sexual activity: Not Currently

## 2021-02-07 ENCOUNTER — Ambulatory Visit (INDEPENDENT_AMBULATORY_CARE_PROVIDER_SITE_OTHER): Payer: PPO | Admitting: Physician Assistant

## 2021-02-07 ENCOUNTER — Encounter: Payer: Self-pay | Admitting: Physician Assistant

## 2021-02-07 ENCOUNTER — Ambulatory Visit: Payer: Self-pay

## 2021-02-07 DIAGNOSIS — M25532 Pain in left wrist: Secondary | ICD-10-CM

## 2021-02-07 NOTE — Progress Notes (Signed)
Office Visit Note   Patient: ANGEL WEEDON           Date of Birth: 1946-04-12           MRN: [id number] Visit Date: 02/07/2021              Requested by: Belva Crome, [place] [address] [place],  [place] [address] PCP: Belva Crome, MD  Chief Complaint  Patient presents with  . Left Wrist - Follow-up    DOI/  left colles fx f/u       HPI: Patient is a pleasant 75 year old right-hand-dominant gentleman who is now 3 weeks status post fall onto outstretched hand he sustained a left Colles' fracture.  He was seen and evaluated by Dr. Sharol Given and he elected to go forward with conservative treatment he has been wearing a Velcro wrist splint he has no complaints and actually states that his wrist is feeling much better  Assessment & Plan: Visit Diagnoses:  1. 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  He knows not to do any weightbearing work with his left hand can wiggle his fingers to help with some of the symmetrical swelling.  Follow-up for final visit in 3 weeks x-rays of his wrist should be taken at that visit  Follow-Up Instructions: No follow-ups on file.   Ortho Exam  Patient is alert, oriented, no adenopathy, well-dressed, normal affect, normal respiratory effort. Examination decrease in soft tissue swelling he is able to oppose all his fingers without difficulty he has mild tenderness to deep palpation over the distal radius strong radial pulse less than 2-second capillary refill of all his digits no signs of cellulitis  Imaging: No results found. No images are attached to the encounter.  Labs: No results found for: HGBA1C, ESRSEDRATE, CRP, LABURIC, REPTSTATUS, GRAMSTAIN, CULT, LABORGA   No results found for: ALBUMIN, PREALBUMIN, CBC  No results found for: MG No results found for: VD25OH  No results found for: PREALBUMIN CBC EXTENDED Latest Ref Rng & Units 09/28/2014 12/14/2008 12/13/2008  WBC 4.0 - 10.5 K/uL 5.1 9.8 -   RBC 4.22 - 5.81 MIL/uL 4.30 3.95(L) -  HGB 13.0 - 17.0 g/dL 12.3(L) 11.3(L) 11.8(L)  HCT 39.0 - 52.0 % 37.4(L) 34.5(L) 35.2(L)  PLT 150 - 400 K/uL 221 175 -     There is no height or weight on file to calculate BMI.  Orders:  Orders Placed This Encounter  Procedures  . XR Wrist 2 Views Left   No orders of the defined types were placed in this encounter.    Procedures: No procedures performed  Clinical Data: No additional findings.  ROS:  All other systems negative, except as noted in the HPI. Review of Systems  Objective: Vital Signs: There were no vitals taken for this visit.  Specialty Comments:  No specialty comments available.  PMFS History: Patient Active Problem List   Diagnosis Date Noted  . Chest pain   . Elevated blood pressure 08/20/2014  . Prostate cancer ([place]) 08/20/2014  . 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 Diabetes mellitus type II, uncontrolled ([place]) 08/20/2014  . Hyperlipidemia 08/20/2014   Past Medical History:  Diagnosis Date  . Cancer [facility])    prostate  . Chest pain   . Diabetes mellitus type II, uncontrolled ([place]) 08/20/2014  . Elevated blood pressure 08/20/2014  . Hyperlipidemia 08/20/2014  . Lymphoma ([place])   .  Prostate cancer ([place]) 08/20/2014    Family History  Problem Relation Age of Onset  . Stroke Father   . Hypertension Sister   . Anemia Neg Hx   . Arrhythmia Neg Hx   . Asthma Neg Hx   . Clotting disorder Neg Hx   . Fainting Neg Hx   . Heart attack Neg Hx   . Heart disease Neg Hx   . Heart failure Neg Hx   . Hyperlipidemia Neg Hx     Past Surgical History:  Procedure Laterality Date  . BACK SURGERY     removal of lymphoma  . LEFT HEART CATHETERIZATION WITH CORONARY ANGIOGRAM N/A 09/28/2014   Procedure: LEFT HEART CATHETERIZATION WITH CORONARY ANGIOGRAM;  Surgeon: Jettie Booze, MD;  Location: [facility] CATH LAB;  Service: Cardiovascular;  Laterality: N/A;  . PROSTATECTOMY     Social History    Occupational History  . Not on file  Tobacco Use  . Smoking status: Never Smoker  . Smokeless tobacco: Not on file  Substance and Sexual Activity  . Alcohol use: Not on file  . Drug use: No  . Sexual activity: Not Currently

## 2021-02-28 ENCOUNTER — Ambulatory Visit (INDEPENDENT_AMBULATORY_CARE_PROVIDER_SITE_OTHER): Payer: PPO

## 2021-02-28 ENCOUNTER — Ambulatory Visit: Payer: PPO | Admitting: Physician Assistant

## 2021-02-28 ENCOUNTER — Encounter: Payer: Self-pay | Admitting: Physician Assistant

## 2021-02-28 DIAGNOSIS — M25532 Pain in left wrist: Secondary | ICD-10-CM

## 2021-02-28 NOTE — Progress Notes (Signed)
Office Visit Note   Patient: Ivan Simon           Date of Birth: 1946-01-11           MRN: [id number] Visit Date: 02/28/2021              Requested by: Belva Crome, [place] [address] [place],  [place] [address] PCP: Belva Crome, MD  Chief Complaint  Patient presents with  . Left Wrist - Follow-up      HPI: Patient is a pleasant 75 year old right-handed dominant gentleman who is now 6 weeks status post left Colles' fracture.  In his initial consultation with Dr. Sharol Given he elected to treat this conservatively rather than open reduction internal fixation.  He does still have pain especially with flexion and extension of the wrist however he does feel he is better than his prior visit  Assessment & Plan: Visit Diagnoses:  1. Pain in left wrist     Plan: Can wean out of the splint as tolerated.  We will follow-up in 1 month.  Follow-Up Instructions: No follow-ups on file.   Ortho Exam  Patient is alert, oriented, no adenopathy, well-dressed, normal affect, normal respiratory effort. Mild soft tissue swelling of his wrist and hand.  He has minimum tenderness over the wrist but with extension and flexion of the wrist can reproduce some tenderness.  No ecchymosis no cellulitis distal CMS is intact  Imaging: XR Wrist 2 Views Left  Result Date: 02/28/2021 2 views of his wrist demonstrate stable fracture.  No new acute osseous changes  No images are attached to the encounter.  Labs: No results found for: HGBA1C, ESRSEDRATE, CRP, LABURIC, REPTSTATUS, GRAMSTAIN, CULT, LABORGA   No results found for: ALBUMIN, PREALBUMIN, CBC  No results found for: MG No results found for: VD25OH  No results found for: PREALBUMIN CBC EXTENDED Latest Ref Rng & Units 09/28/2014 12/14/2008 12/13/2008  WBC 4.0 - 10.5 K/uL 5.1 9.8 -  RBC 4.22 - 5.81 MIL/uL 4.30 3.95(L) -  HGB 13.0 - 17.0 g/dL 12.3(L) 11.3(L) 11.8(L)  HCT 39.0 - 52.0 % 37.4(L) 34.5(L) 35.2(L)  PLT 150 - 400  K/uL 221 175 -     There is no height or weight on file to calculate BMI.  Orders:  Orders Placed This Encounter  Procedures  . XR Wrist 2 Views Left   No orders of the defined types were placed in this encounter.    Procedures: No procedures performed  Clinical Data: No additional findings.  ROS:  All other systems negative, except as noted in the HPI. Review of Systems  Objective: Vital Signs: There were no vitals taken for this visit.  Specialty Comments:  No specialty comments available.  PMFS History: Patient Active Problem List   Diagnosis Date Noted  . Chest pain   . Elevated blood pressure 08/20/2014  . Prostate cancer ([place]) 08/20/2014  . 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 Diabetes mellitus type II, uncontrolled ([place]) 08/20/2014  . Hyperlipidemia 08/20/2014   Past Medical History:  Diagnosis Date  . Cancer [facility])    prostate  . Chest pain   . Diabetes mellitus type II, uncontrolled ([place]) 08/20/2014  . Elevated blood pressure 08/20/2014  . Hyperlipidemia 08/20/2014  . Lymphoma ([place])   . Prostate cancer ([place]) 08/20/2014    Family History  Problem Relation Age of Onset  . Stroke Father   . Hypertension Sister   . Anemia Neg Hx   .  Arrhythmia Neg Hx   . Asthma Neg Hx   . Clotting disorder Neg Hx   . Fainting Neg Hx   . Heart attack Neg Hx   . Heart disease Neg Hx   . Heart failure Neg Hx   . Hyperlipidemia Neg Hx     Past Surgical History:  Procedure Laterality Date  . BACK SURGERY     removal of lymphoma  . LEFT HEART CATHETERIZATION WITH CORONARY ANGIOGRAM N/A 09/28/2014   Procedure: LEFT HEART CATHETERIZATION WITH CORONARY ANGIOGRAM;  Surgeon: Jettie Booze, MD;  Location: [facility] CATH LAB;  Service: Cardiovascular;  Laterality: N/A;  . PROSTATECTOMY     Social History   Occupational History  . Not on file  Tobacco Use  . Smoking status: Never Smoker  . Smokeless tobacco: Not on file  Substance and Sexual Activity  .  Alcohol use: Not on file  . Drug use: No  . Sexual activity: Not Currently

## 2021-03-28 ENCOUNTER — Ambulatory Visit: Payer: PPO | Admitting: Physician Assistant

## 2021-03-28 ENCOUNTER — Encounter: Payer: Self-pay | Admitting: Physician Assistant

## 2021-03-28 ENCOUNTER — Other Ambulatory Visit: Payer: Self-pay

## 2021-03-28 DIAGNOSIS — M25532 Pain in left wrist: Secondary | ICD-10-CM | POA: Diagnosis not present

## 2021-03-28 NOTE — Progress Notes (Signed)
Office Visit Note   Patient: Ivan Simon           Date of Birth: 1946/06/14           MRN: [id number] Visit Date: 03/28/2021              Requested by: Belva Crome, [place] [place],   [address] PCP: Belva Crome, MD  Chief Complaint  Patient presents with   Left Wrist - Follow-up      HPI: Patient presents in follow-up for his left Colles' fracture that he is elected to treat conservatively.  He continues to improve.  He states he has some discomfort when pushing himself out of the chair and with carrying heavier things but feels better than before.  Assessment & Plan: Visit Diagnoses: No diagnosis found.  Plan: Patient may follow-up as needed.  He understands that he will have a likeliness of developing arthritis in this wrist.  Can use topical analgesics.  Follow-Up Instructions: No follow-ups on file.   Ortho Exam  Patient is alert, oriented, no adenopathy, well-dressed, normal affect, normal respiratory effort. Examination of the wrist he has mild soft tissue swelling in his hand.  His pulse is strong.  He has some limited motion but no specific pain with palpation no signs of infection or cellulitis no clinical deformity  Imaging: No results found. No images are attached to the encounter.  Labs: No results found for: HGBA1C, ESRSEDRATE, CRP, LABURIC, REPTSTATUS, GRAMSTAIN, CULT, LABORGA   No results found for: ALBUMIN, PREALBUMIN, CBC  No results found for: MG No results found for: VD25OH  No results found for: PREALBUMIN CBC EXTENDED Latest Ref Rng & Units 09/28/2014 12/14/2008 12/13/2008  WBC 4.0 - 10.5 K/uL 5.1 9.8 -  RBC 4.22 - 5.81 MIL/uL 4.30 3.95(L) -  HGB 13.0 - 17.0 g/dL 12.3(L) 11.3(L) 11.8(L)  HCT 39.0 - 52.0 % 37.4(L) 34.5(L) 35.2(L)  PLT 150 - 400 K/uL 221 175 -     There is no height or weight on file to calculate BMI.  Orders:  No orders of the defined types were placed in this encounter.  No  orders of the defined types were placed in this encounter.    Procedures: No procedures performed  Clinical Data: No additional findings.  ROS:  All other systems negative, except as noted in the HPI. Review of Systems  Objective: Vital Signs: There were no vitals taken for this visit.  Specialty Comments:  No specialty comments available.  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   Diabetes mellitus type II, uncontrolled ([place]) 08/20/2014   Hyperlipidemia 08/20/2014   Past Medical History:  Diagnosis Date   Cancer [facility])    prostate   Chest pain    Diabetes mellitus type II, uncontrolled ([place]) 08/20/2014   Elevated blood pressure 08/20/2014   Hyperlipidemia 08/20/2014   Lymphoma ([place])    Prostate cancer ([place]) 08/20/2014    Family History  Problem Relation Age of Onset   Stroke Father    Hypertension Sister    Anemia Neg Hx    Arrhythmia Neg Hx    Asthma Neg Hx    Clotting disorder Neg Hx    Fainting Neg Hx    Heart attack Neg Hx    Heart disease Neg Hx  Heart failure Neg Hx    Hyperlipidemia Neg Hx     Past Surgical History:  Procedure Laterality Date   BACK SURGERY     removal of lymphoma   LEFT HEART CATHETERIZATION WITH CORONARY ANGIOGRAM N/A 09/28/2014   Procedure: LEFT HEART CATHETERIZATION WITH CORONARY ANGIOGRAM;  Surgeon: Jettie Booze, MD;  Location: [facility] CATH LAB;  Service: Cardiovascular;  Laterality: N/A;   PROSTATECTOMY     Social History   Occupational History   Not on file  Tobacco Use   Smoking status: Never   Smokeless tobacco: Not on file  Substance and Sexual Activity   Alcohol use: Not on file   Drug use: No   Sexual activity: Not Currently

## 2021-04-16 DIAGNOSIS — R059 Cough, unspecified: Secondary | ICD-10-CM | POA: Diagnosis not present

## 2021-04-16 DIAGNOSIS — U071 COVID-19: Secondary | ICD-10-CM | POA: Diagnosis not present
